# Patient Record
Sex: Female | Born: 1950
Health system: Southern US, Community
[De-identification: ages and names within clinical notes are randomized; demographics above are authoritative.]

## PROBLEM LIST (undated history)

## (undated) DIAGNOSIS — E785 Hyperlipidemia, unspecified: Secondary | ICD-10-CM

## (undated) DIAGNOSIS — E559 Vitamin D deficiency, unspecified: Secondary | ICD-10-CM

## (undated) DIAGNOSIS — M1990 Unspecified osteoarthritis, unspecified site: Secondary | ICD-10-CM

## (undated) HISTORY — DX: Unspecified osteoarthritis, unspecified site: M19.90

## (undated) HISTORY — DX: Hyperlipidemia, unspecified: E78.5

## (undated) HISTORY — DX: Vitamin D deficiency, unspecified: E55.9

## (undated) HISTORY — PX: ABDOMINAL HYSTERECTOMY: SHX81

---

## 2002-11-03 ENCOUNTER — Other Ambulatory Visit: Admission: RE | Admit: 2002-11-03 | Discharge: 2002-11-03 | Payer: Self-pay | Admitting: Gynecology

## 2003-12-30 ENCOUNTER — Other Ambulatory Visit: Admission: RE | Admit: 2003-12-30 | Discharge: 2003-12-30 | Payer: Self-pay | Admitting: Gynecology

## 2004-09-19 ENCOUNTER — Observation Stay (HOSPITAL_COMMUNITY): Admission: RE | Admit: 2004-09-19 | Discharge: 2004-09-20 | Payer: Self-pay | Admitting: Gynecology

## 2005-11-09 ENCOUNTER — Other Ambulatory Visit: Admission: RE | Admit: 2005-11-09 | Discharge: 2005-11-09 | Payer: Self-pay | Admitting: Gynecology

## 2010-11-16 ENCOUNTER — Other Ambulatory Visit: Payer: Self-pay | Admitting: Gynecology

## 2012-10-16 ENCOUNTER — Encounter: Payer: Self-pay | Admitting: Internal Medicine

## 2012-10-16 ENCOUNTER — Ambulatory Visit (INDEPENDENT_AMBULATORY_CARE_PROVIDER_SITE_OTHER): Payer: PRIVATE HEALTH INSURANCE | Admitting: Nurse Practitioner

## 2012-10-16 ENCOUNTER — Encounter: Payer: Self-pay | Admitting: Nurse Practitioner

## 2012-10-16 VITALS — BP 122/84 | HR 81 | Temp 97.2°F | Ht 69.0 in | Wt 192.0 lb

## 2012-10-16 DIAGNOSIS — E1169 Type 2 diabetes mellitus with other specified complication: Secondary | ICD-10-CM | POA: Insufficient documentation

## 2012-10-16 DIAGNOSIS — Z13228 Encounter for screening for other metabolic disorders: Secondary | ICD-10-CM

## 2012-10-16 DIAGNOSIS — Z1211 Encounter for screening for malignant neoplasm of colon: Secondary | ICD-10-CM

## 2012-10-16 DIAGNOSIS — E785 Hyperlipidemia, unspecified: Secondary | ICD-10-CM

## 2012-10-16 DIAGNOSIS — Z1321 Encounter for screening for nutritional disorder: Secondary | ICD-10-CM

## 2012-10-16 LAB — COMPLETE METABOLIC PANEL WITH GFR
ALT: 10 U/L (ref 0–35)
AST: 14 U/L (ref 0–37)
Albumin: 4 g/dL (ref 3.5–5.2)
Alkaline Phosphatase: 95 U/L (ref 39–117)
Chloride: 106 mEq/L (ref 96–112)
GFR, Est African American: 84 mL/min
GFR, Est Non African American: 73 mL/min
Glucose, Bld: 83 mg/dL (ref 70–99)
Sodium: 143 mEq/L (ref 135–145)
Total Bilirubin: 0.3 mg/dL (ref 0.3–1.2)

## 2012-10-16 MED ORDER — ATORVASTATIN CALCIUM 10 MG PO TABS: 10.0000 mg | ORAL_TABLET | Freq: Every day | ORAL | Status: DC

## 2012-10-16 NOTE — Progress Notes (Signed)
  Subjective:    Patient ID: Lindsey Phillips, female    DOB: 1951/03/16, 62 y.o.   MRN: [id number]  Hyperlipidemia This is a chronic problem. The current episode started more than 1 year ago. Lipid results: New patient to practice so we have no record of last labs. There are no known factors aggravating her hyperlipidemia. Pertinent negatives include no chest pain, focal sensory loss, leg pain, myalgias or shortness of breath. Current antihyperlipidemic treatment includes statins. Compliance problems include adherence to diet.  Risk factors for coronary artery disease include post-menopausal.      Review of Systems  Constitutional: Negative.   HENT: Negative.   Eyes: Negative.   Respiratory: Negative.  Negative for shortness of breath.   Cardiovascular: Negative for chest pain.  Endocrine: Negative.   Genitourinary: Negative.   Musculoskeletal: Negative.  Negative for myalgias.  Skin: Negative.   Neurological: Negative.   Hematological: Negative.   Psychiatric/Behavioral: Negative.        Objective:   Physical Exam  Constitutional: She is oriented to person, place, and time. She appears well-developed and well-nourished.  HENT:  Head: Normocephalic.  Right Ear: External ear normal.  Nose: Nose normal.  Eyes: EOM are normal. Pupils are equal, round, and reactive to light.  Neck: Normal range of motion. Neck supple. No JVD present. Carotid bruit is not present.  Cardiovascular: Normal rate, regular rhythm, normal heart sounds and intact distal pulses.   No murmur heard. Pulmonary/Chest: Effort normal and breath sounds normal. She has no wheezes. She has no rales.  Abdominal: Soft. Bowel sounds are normal. She exhibits no distension and no mass.  Musculoskeletal: Normal range of motion.  Lymphadenopathy:    She has no cervical adenopathy.  Neurological: She is alert and oriented to person, place, and time. She has normal reflexes.  Skin: Skin is warm and dry.  Psychiatric: She has  a normal mood and affect. Her behavior is normal. Judgment and thought content normal.  BP 122/84  Pulse 81  Temp(Src) 97.2 F (36.2 C) (Oral)  Ht 5\' 9"  (1.753 m)  Wt 192 lb (87.091 kg)  BMI 28.34 kg/m2         Assessment & Plan:  1. Hyperlipidemia LDL goal < 100 - COMPLETE METABOLIC PANEL WITH GFR - NMR Lipoprofile with Lipids  2. Encounter for vitamin deficiency screening - Vitamin D 25 hydroxy  Lipitor 10 Mg 1 PO QD Low fat diet and exercise Labs pending  Referral For repeat Colonoscopy Will schedule Mammogram on mobile truck Will schedule CPE for PAP Mary-Margaret Daphine Deutscher, FNP

## 2012-10-16 NOTE — Patient Instructions (Signed)

## 2012-10-17 ENCOUNTER — Other Ambulatory Visit: Payer: Self-pay | Admitting: Nurse Practitioner

## 2012-10-17 ENCOUNTER — Telehealth: Payer: Self-pay | Admitting: Nurse Practitioner

## 2012-10-17 LAB — NMR LIPOPROFILE WITH LIPIDS
HDL Size: 8.5 nm — ABNORMAL LOW (ref >=9.2)
HDL-C: 35 mg/dL — ABNORMAL LOW (ref >=40)
LDL Size: 20.3 nm — ABNORMAL LOW (ref >20.5)
LP-IR Score: 66 — ABNORMAL HIGH (ref <=45)
Large HDL-P: 1.5 umol/L — ABNORMAL LOW (ref >=4.8)
Small LDL Particle Number: 1021 nmol/L — ABNORMAL HIGH (ref <=527)
Triglycerides: 96 mg/dL (ref <150)
VLDL Size: 46.5 nm (ref <=46.6)

## 2012-10-17 MED ORDER — ATORVASTATIN CALCIUM 20 MG PO TABS: 20.0000 mg | ORAL_TABLET | Freq: Every day | ORAL | Status: DC

## 2012-10-17 MED ORDER — VITAMIN D (ERGOCALCIFEROL) 1.25 MG (50000 UNIT) PO CAPS: 50000.0000 [IU] | ORAL_CAPSULE | ORAL | Status: DC

## 2012-10-17 NOTE — Telephone Encounter (Signed)
Pt. Notified of lab results. 

## 2012-12-11 ENCOUNTER — Encounter: Payer: Self-pay | Admitting: Internal Medicine

## 2013-05-26 ENCOUNTER — Encounter: Payer: Self-pay | Admitting: Gynecology

## 2015-11-17 ENCOUNTER — Encounter (INDEPENDENT_AMBULATORY_CARE_PROVIDER_SITE_OTHER): Payer: Self-pay

## 2015-11-18 ENCOUNTER — Encounter: Payer: Self-pay | Admitting: Nurse Practitioner

## 2015-11-18 ENCOUNTER — Ambulatory Visit (INDEPENDENT_AMBULATORY_CARE_PROVIDER_SITE_OTHER): Payer: Medicare Other

## 2015-11-18 ENCOUNTER — Ambulatory Visit (INDEPENDENT_AMBULATORY_CARE_PROVIDER_SITE_OTHER): Payer: Medicare Other | Admitting: Nurse Practitioner

## 2015-11-18 VITALS — BP 123/73 | HR 83 | Temp 97.6°F | Ht 69.0 in | Wt 201.8 lb

## 2015-11-18 DIAGNOSIS — Z1159 Encounter for screening for other viral diseases: Secondary | ICD-10-CM | POA: Diagnosis not present

## 2015-11-18 DIAGNOSIS — Z23 Encounter for immunization: Secondary | ICD-10-CM

## 2015-11-18 DIAGNOSIS — E785 Hyperlipidemia, unspecified: Secondary | ICD-10-CM

## 2015-11-18 DIAGNOSIS — Z9289 Personal history of other medical treatment: Secondary | ICD-10-CM

## 2015-11-18 DIAGNOSIS — Z1212 Encounter for screening for malignant neoplasm of rectum: Secondary | ICD-10-CM | POA: Diagnosis not present

## 2015-11-18 NOTE — Patient Instructions (Signed)
Health Maintenance, Female Adopting a healthy lifestyle and getting preventive care can go a long way to promote health and wellness. Talk with your health care provider about what schedule of regular examinations is right for you. This is a good chance for you to check in with your provider about disease prevention and staying healthy. In between checkups, there are plenty of things you can do on your own. Experts have done a lot of research about which lifestyle changes and preventive measures are most likely to keep you healthy. Ask your health care provider for more information. WEIGHT AND DIET  Eat a healthy diet  Be sure to include plenty of vegetables, fruits, low-fat dairy products, and lean protein.  Do not eat a lot of foods high in solid fats, added sugars, or salt.  Get regular exercise. This is one of the most important things you can do for your health.  Most adults should exercise for at least 150 minutes each week. The exercise should increase your heart rate and make you sweat (moderate-intensity exercise).  Most adults should also do strengthening exercises at least twice a week. This is in addition to the moderate-intensity exercise.  Maintain a healthy weight  Body mass index (BMI) is a measurement that can be used to identify possible weight problems. It estimates body fat based on height and weight. Your health care provider can help determine your BMI and help you achieve or maintain a healthy weight.  For females 20 years of age and older:   A BMI below 18.5 is considered underweight.  A BMI of 18.5 to 24.9 is normal.  A BMI of 25 to 29.9 is considered overweight.  A BMI of 30 and above is considered obese.  Watch levels of cholesterol and blood lipids  You should start having your blood tested for lipids and cholesterol at 65 years of age, then have this test every 5 years.  You may need to have your cholesterol levels checked more often if:  Your lipid  or cholesterol levels are high.  You are older than 65 years of age.  You are at high risk for heart disease.  CANCER SCREENING   Lung Cancer  Lung cancer screening is recommended for adults 55-80 years old who are at high risk for lung cancer because of a history of smoking.  A yearly low-dose CT scan of the lungs is recommended for people who:  Currently smoke.  Have quit within the past 15 years.  Have at least a 30-pack-year history of smoking. A pack year is smoking an average of one pack of cigarettes a day for 1 year.  Yearly screening should continue until it has been 15 years since you quit.  Yearly screening should stop if you develop a health problem that would prevent you from having lung cancer treatment.  Breast Cancer  Practice breast self-awareness. This means understanding how your breasts normally appear and feel.  It also means doing regular breast self-exams. Let your health care provider know about any changes, no matter how small.  If you are in your 20s or 30s, you should have a clinical breast exam (CBE) by a health care provider every 1-3 years as part of a regular health exam.  If you are 40 or older, have a CBE every year. Also consider having a breast X-ray (mammogram) every year.  If you have a family history of breast cancer, talk to your health care provider about genetic screening.  If you   are at high risk for breast cancer, talk to your health care provider about having an MRI and a mammogram every year.  Breast cancer gene (BRCA) assessment is recommended for women who have family members with BRCA-related cancers. BRCA-related cancers include:  Breast.  Ovarian.  Tubal.  Peritoneal cancers.  Results of the assessment will determine the need for genetic counseling and BRCA1 and BRCA2 testing. Cervical Cancer Your health care provider may recommend that you be screened regularly for cancer of the pelvic organs (ovaries, uterus, and  vagina). This screening involves a pelvic examination, including checking for microscopic changes to the surface of your cervix (Pap test). You may be encouraged to have this screening done every 3 years, beginning at age 21.  For women ages 30-65, health care providers may recommend pelvic exams and Pap testing every 3 years, or they may recommend the Pap and pelvic exam, combined with testing for human papilloma virus (HPV), every 5 years. Some types of HPV increase your risk of cervical cancer. Testing for HPV may also be done on women of any age with unclear Pap test results.  Other health care providers may not recommend any screening for nonpregnant women who are considered low risk for pelvic cancer and who do not have symptoms. Ask your health care provider if a screening pelvic exam is right for you.  If you have had past treatment for cervical cancer or a condition that could lead to cancer, you need Pap tests and screening for cancer for at least 20 years after your treatment. If Pap tests have been discontinued, your risk factors (such as having a new sexual partner) need to be reassessed to determine if screening should resume. Some women have medical problems that increase the chance of getting cervical cancer. In these cases, your health care provider may recommend more frequent screening and Pap tests. Colorectal Cancer  This type of cancer can be detected and often prevented.  Routine colorectal cancer screening usually begins at 65 years of age and continues through 65 years of age.  Your health care provider may recommend screening at an earlier age if you have risk factors for colon cancer.  Your health care provider may also recommend using home test kits to check for hidden blood in the stool.  A small camera at the end of a tube can be used to examine your colon directly (sigmoidoscopy or colonoscopy). This is done to check for the earliest forms of colorectal  cancer.  Routine screening usually begins at age 50.  Direct examination of the colon should be repeated every 5-10 years through 65 years of age. However, you may need to be screened more often if early forms of precancerous polyps or small growths are found. Skin Cancer  Check your skin from head to toe regularly.  Tell your health care provider about any new moles or changes in moles, especially if there is a change in a mole's shape or color.  Also tell your health care provider if you have a mole that is larger than the size of a pencil eraser.  Always use sunscreen. Apply sunscreen liberally and repeatedly throughout the day.  Protect yourself by wearing long sleeves, pants, a wide-brimmed hat, and sunglasses whenever you are outside. HEART DISEASE, DIABETES, AND HIGH BLOOD PRESSURE   High blood pressure causes heart disease and increases the risk of stroke. High blood pressure is more likely to develop in:  People who have blood pressure in the high end   of the normal range (130-139/85-89 mm Hg).  People who are overweight or obese.  People who are African American.  If you are 33-67 years of age, have your blood pressure checked every 3-5 years. If you are 4 years of age or older, have your blood pressure checked every year. You should have your blood pressure measured twice--once when you are at a hospital or clinic, and once when you are not at a hospital or clinic. Record the average of the two measurements. To check your blood pressure when you are not at a hospital or clinic, you can use:  An automated blood pressure machine at a pharmacy.  A home blood pressure monitor.  If you are between 66 years and 96 years old, ask your health care provider if you should take aspirin to prevent strokes.  Have regular diabetes screenings. This involves taking a blood sample to check your fasting blood sugar level.  If you are at a normal weight and have a low risk for diabetes,  have this test once every three years after 65 years of age.  If you are overweight and have a high risk for diabetes, consider being tested at a younger age or more often. PREVENTING INFECTION  Hepatitis B  If you have a higher risk for hepatitis B, you should be screened for this virus. You are considered at high risk for hepatitis B if:  You were born in a country where hepatitis B is common. Ask your health care provider which countries are considered high risk.  Your parents were born in a high-risk country, and you have not been immunized against hepatitis B (hepatitis B vaccine).  You have HIV or AIDS.  You use needles to inject street drugs.  You live with someone who has hepatitis B.  You have had sex with someone who has hepatitis B.  You get hemodialysis treatment.  You take certain medicines for conditions, including cancer, organ transplantation, and autoimmune conditions. Hepatitis C  Blood testing is recommended for:  Everyone born from [birth year] through 1965.  Anyone with known risk factors for hepatitis C. Sexually transmitted infections (STIs)  You should be screened for sexually transmitted infections (STIs) including gonorrhea and chlamydia if:  You are sexually active and are younger than 65 years of age.  You are older than 65 years of age and your health care provider tells you that you are at risk for this type of infection.  Your sexual activity has changed since you were last screened and you are at an increased risk for chlamydia or gonorrhea. Ask your health care provider if you are at risk.  If you do not have HIV, but are at risk, it may be recommended that you take a prescription medicine daily to prevent HIV infection. This is called pre-exposure prophylaxis (PrEP). You are considered at risk if:  You are sexually active and do not regularly use condoms or know the HIV status of your partner(s).  You take drugs by injection.  You are sexually  active with a partner who has HIV. Talk with your health care provider about whether you are at high risk of being infected with HIV. If you choose to begin PrEP, you should first be tested for HIV. You should then be tested every 3 months for as long as you are taking PrEP.  PREGNANCY   If you are premenopausal and you may become pregnant, ask your health care provider about preconception counseling.  If you may  become pregnant, take 400 to 800 micrograms (mcg) of folic acid every day.  If you want to prevent pregnancy, talk to your health care provider about birth control (contraception). OSTEOPOROSIS AND MENOPAUSE   Osteoporosis is a disease in which the bones lose minerals and strength with aging. This can result in serious bone fractures. Your risk for osteoporosis can be identified using a bone density scan.  If you are 61 years of age or older, or if you are at risk for osteoporosis and fractures, ask your health care provider if you should be screened.  Ask your health care provider whether you should take a calcium or vitamin D supplement to lower your risk for osteoporosis.  Menopause may have certain physical symptoms and risks.  Hormone replacement therapy may reduce some of these symptoms and risks. Talk to your health care provider about whether hormone replacement therapy is right for you.  HOME CARE INSTRUCTIONS   Schedule regular health, dental, and eye exams.  Stay current with your immunizations.   Do not use any tobacco products including cigarettes, chewing tobacco, or electronic cigarettes.  If you are pregnant, do not drink alcohol.  If you are breastfeeding, limit how much and how often you drink alcohol.  Limit alcohol intake to no more than 1 drink per day for nonpregnant women. One drink equals 12 ounces of beer, 5 ounces of wine, or 1 ounces of hard liquor.  Do not use street drugs.  Do not share needles.  Ask your health care provider for help if  you need support or information about quitting drugs.  Tell your health care provider if you often feel depressed.  Tell your health care provider if you have ever been abused or do not feel safe at home.   This information is not intended to replace advice given to you by your health care provider. Make sure you discuss any questions you have with your health care provider.   Document Released: 01/16/2011 Document Revised: 07/24/2014 Document Reviewed: 06/04/2013 Elsevier Interactive Patient Education [employer].

## 2015-11-18 NOTE — Progress Notes (Signed)
   Subjective:    Patient ID: Lindsey Phillips, female    DOB: [birth year], 65 y.o.   MRN: [id number]   Patient here today for follow up of chronic medical problems.  Outpatient Encounter Prescriptions as of 11/18/2015  Medication Sig  . atorvastatin (LIPITOR) 20 MG tablet Take 1 tablet (20 mg total) by mouth daily.  . Vitamin D, Ergocalciferol, (DRISDOL) 50000 UNITS CAPS Take 1 capsule (50,000 Units total) by mouth every 7 (seven) days.   No facility-administered encounter medications on file as of 11/18/2015.   * Patient has not been seen since 2014 and has been out of cholesterol meds for some time. Hyperlipidemia This is a chronic problem. The current episode started more than 1 year ago. Recent lipid tests were reviewed and are variable. There are no known factors aggravating her hyperlipidemia. She is currently on no antihyperlipidemic treatment. The current treatment provides moderate improvement of lipids. Compliance problems include adherence to diet and adherence to exercise.  Risk factors for coronary artery disease include dyslipidemia and post-menopausal.      Review of Systems  Constitutional: Negative.   HENT: Negative.   Respiratory: Negative.   Cardiovascular: Negative.   Genitourinary: Negative.   Neurological: Negative.   Psychiatric/Behavioral: Negative.   All other systems reviewed and are negative.      Objective:   Physical Exam  Constitutional: She is oriented to Phillips, place, and time. She appears well-developed and well-nourished.  HENT:  Nose: Nose normal.  Mouth/Throat: Oropharynx is clear and moist.  Eyes: EOM are normal.  Neck: Trachea normal, normal range of motion and full passive range of motion without pain. Neck supple. No JVD present. Carotid bruit is not present. No thyromegaly present.  Cardiovascular: Normal rate, regular rhythm, normal heart sounds and intact distal pulses.  Exam reveals no gallop and no friction rub.   No murmur  heard. Pulmonary/Chest: Effort normal and breath sounds normal.  Abdominal: Soft. Bowel sounds are normal. She exhibits no distension and no mass. There is no tenderness.  Musculoskeletal: Normal range of motion.  Lymphadenopathy:    She has no cervical adenopathy.  Neurological: She is alert and oriented to Phillips, place, and time. She has normal reflexes.  Skin: Skin is warm and dry.  Psychiatric: She has a normal mood and affect. Her behavior is normal. Judgment and thought content normal.    BP 123/73 mmHg  Pulse 83  Temp(Src) 97.6 F (36.4 C) (Oral)  Ht _0  (1.753 m)  Wt 201 lb 12.8 oz (91.536 kg)  BMI 29.79 kg/m2  Chest x ray- no cardiopulmonary disease noted-Preliminary reading by Ronnald Collum, FNP  [facility]  EKG- Kerry Hough, FNP      Assessment & Plan:  1. Hyperlipidemia with target LDL less than 100 Low fat diet - CMP14+EGFR - Lipid panel - DG Chest 2 View; Future - EKG 12-Lead  2. Screening for malignant neoplasm of the rectum - Fecal occult blood, imunochemical; Future - Ambulatory referral to Gastroenterology  3. Need for hepatitis C screening test - Hepatitis C antibody  4. H/O mammogram - MM Digital Screening; Future   Will schedule PAP for next visit Labs pending Health maintenance reviewed Diet and exercise encouraged Continue all meds Follow up  In 6 months   [place], FNP

## 2015-11-18 NOTE — Addendum Note (Signed)
Addended by: Caryl BisBOWMAN, Mikel Hardgrove M on: 11/18/2015 05:00 PM   Modules accepted: Orders, SmartSet

## 2015-11-19 LAB — LIPID PANEL
CHOLESTEROL TOTAL: 247 mg/dL — AB (ref 100–199)
Chol/HDL Ratio: 7.3 ratio units — ABNORMAL HIGH (ref 0.0–4.4)
HDL: 34 mg/dL — ABNORMAL LOW (ref >39)
LDL Calculated: 174 mg/dL — ABNORMAL HIGH (ref 0–99)
Triglycerides: 196 mg/dL — ABNORMAL HIGH (ref 0–149)
VLDL Cholesterol Cal: 39 mg/dL (ref 5–40)

## 2015-11-19 LAB — CMP14+EGFR
ALT: 14 IU/L (ref 0–32)
AST: 20 IU/L (ref 0–40)
Albumin/Globulin Ratio: 1.3 (ref 1.2–2.2)
Albumin: 4.4 g/dL (ref 3.6–4.8)
Alkaline Phosphatase: 104 IU/L (ref 39–117)
BUN/Creatinine Ratio: 16 (ref 12–28)
BUN: 16 mg/dL (ref 8–27)
Bilirubin Total: 0.2 mg/dL (ref 0.0–1.2)
CALCIUM: 9.5 mg/dL (ref 8.7–10.3)
CHLORIDE: 103 mmol/L (ref 96–106)
CO2: 25 mmol/L (ref 18–29)
CREATININE: 1.01 mg/dL — AB (ref 0.57–1.00)
GFR calc Af Amer: 68 mL/min/{1.73_m2} (ref >59)
GFR, EST NON AFRICAN AMERICAN: 59 mL/min/{1.73_m2} — AB (ref >59)
GLOBULIN, TOTAL: 3.3 g/dL (ref 1.5–4.5)
Glucose: 74 mg/dL (ref 65–99)
POTASSIUM: 3.4 mmol/L — AB (ref 3.5–5.2)
Sodium: 143 mmol/L (ref 134–144)
Total Protein: 7.7 g/dL (ref 6.0–8.5)

## 2015-11-19 LAB — HEPATITIS C ANTIBODY

## 2016-02-14 ENCOUNTER — Encounter: Payer: Medicare Other | Admitting: *Deleted

## 2016-02-14 DIAGNOSIS — Z1231 Encounter for screening mammogram for malignant neoplasm of breast: Secondary | ICD-10-CM | POA: Diagnosis not present

## 2016-05-23 ENCOUNTER — Encounter: Payer: Self-pay | Admitting: Nurse Practitioner

## 2016-05-23 ENCOUNTER — Ambulatory Visit (INDEPENDENT_AMBULATORY_CARE_PROVIDER_SITE_OTHER): Payer: Medicare Other | Admitting: Nurse Practitioner

## 2016-05-23 VITALS — BP 124/79 | HR 84 | Temp 96.8°F | Ht 69.0 in | Wt 198.0 lb

## 2016-05-23 DIAGNOSIS — E785 Hyperlipidemia, unspecified: Secondary | ICD-10-CM | POA: Diagnosis not present

## 2016-05-23 DIAGNOSIS — E559 Vitamin D deficiency, unspecified: Secondary | ICD-10-CM | POA: Diagnosis not present

## 2016-05-23 DIAGNOSIS — Z6829 Body mass index (BMI) 29.0-29.9, adult: Secondary | ICD-10-CM | POA: Diagnosis not present

## 2016-05-23 MED ORDER — ATORVASTATIN CALCIUM 20 MG PO TABS: 20.0000 mg | ORAL_TABLET | Freq: Every day | ORAL | 1 refills | Status: DC

## 2016-05-23 NOTE — Progress Notes (Signed)
   Subjective:    Patient ID: Lindsey Phillips, female    DOB: 28-Sep-1950, 65 y.o.   MRN: [id number]   Patient here today for follow up of chronic medical problems.  Outpatient Encounter Prescriptions as of 05/23/2016  Medication Sig  . atorvastatin (LIPITOR) 20 MG tablet Take 1 tablet (20 mg total) by mouth daily.   Hyperlipidemia  This is a chronic problem. The current episode started more than 1 year ago. Recent lipid tests were reviewed and are variable. There are no known factors aggravating her hyperlipidemia. She is currently on no antihyperlipidemic treatment. The current treatment provides moderate improvement of lipids. Compliance problems include adherence to diet and adherence to exercise.  Risk factors for coronary artery disease include dyslipidemia and post-menopausal.  Vitamin d def Patient is no longer taking vitamin d- needs levels checked   Review of Systems  Constitutional: Negative.   HENT: Negative.   Respiratory: Negative.   Cardiovascular: Negative.   Genitourinary: Negative.   Neurological: Negative.   Psychiatric/Behavioral: Negative.   All other systems reviewed and are negative.      Objective:   Physical Exam  Constitutional: She is oriented to person, place, and time. She appears well-developed and well-nourished.  HENT:  Nose: Nose normal.  Mouth/Throat: Oropharynx is clear and moist.  Eyes: EOM are normal.  Neck: Trachea normal, normal range of motion and full passive range of motion without pain. Neck supple. No JVD present. Carotid bruit is not present. No thyromegaly present.  Cardiovascular: Normal rate, regular rhythm, normal heart sounds and intact distal pulses.  Exam reveals no gallop and no friction rub.   No murmur heard. Pulmonary/Chest: Effort normal and breath sounds normal.  Abdominal: Soft. Bowel sounds are normal. She exhibits no distension and no mass. There is no tenderness.  Musculoskeletal: Normal range of motion.    Lymphadenopathy:    She has no cervical adenopathy.  Neurological: She is alert and oriented to person, place, and time. She has normal reflexes.  Skin: Skin is warm and dry.  Psychiatric: She has a normal mood and affect. Her behavior is normal. Judgment and thought content normal.    BP 124/79   Pulse 84   Temp (!) 96.8 F (36 C) (Oral)   Ht '5\' 9"'$  (1.753 m)   Wt 198 lb (89.8 kg)   BMI 29.24 kg/m        Assessment & Plan:  1. Hyperlipidemia with target LDL less than 100 Low fat diet - atorvastatin (LIPITOR) 20 MG tablet; Take 1 tablet (20 mg total) by mouth daily.  Dispense: 90 tablet; Refill: 1 - CMP14+EGFR - Lipid panel  2. Vitamin D deficiency - VITAMIN D 25 Hydroxy (Vit-D Deficiency, Fractures)  3. BMI 29.0-29.9,adult Discussed diet and exercise for person with BMI >25 Will recheck weight in 3-6 months    Labs pending Health maintenance reviewed Diet and exercise encouraged Continue all meds Follow up  In 3 months   [place], FNP

## 2016-05-23 NOTE — Patient Instructions (Signed)
Health Maintenance, Female Adopting a healthy lifestyle and getting preventive care can go a long way to promote health and wellness. Talk with your health care provider about what schedule of regular examinations is right for you. This is a good chance for you to check in with your provider about disease prevention and staying healthy. In between checkups, there are plenty of things you can do on your own. Experts have done a lot of research about which lifestyle changes and preventive measures are most likely to keep you healthy. Ask your health care provider for more information. WEIGHT AND DIET  Eat a healthy diet  Be sure to include plenty of vegetables, fruits, low-fat dairy products, and lean protein.  Do not eat a lot of foods high in solid fats, added sugars, or salt.  Get regular exercise. This is one of the most important things you can do for your health.  Most adults should exercise for at least 150 minutes each week. The exercise should increase your heart rate and make you sweat (moderate-intensity exercise).  Most adults should also do strengthening exercises at least twice a week. This is in addition to the moderate-intensity exercise.  Maintain a healthy weight  Body mass index (BMI) is a measurement that can be used to identify possible weight problems. It estimates body fat based on height and weight. Your health care provider can help determine your BMI and help you achieve or maintain a healthy weight.  For females 20 years of age and older:   A BMI below 18.5 is considered underweight.  A BMI of 18.5 to 24.9 is normal.  A BMI of 25 to 29.9 is considered overweight.  A BMI of 30 and above is considered obese.  Watch levels of cholesterol and blood lipids  You should start having your blood tested for lipids and cholesterol at 65 years of age, then have this test every 5 years.  You may need to have your cholesterol levels checked more often if:  Your lipid  or cholesterol levels are high.  You are older than 65 years of age.  You are at high risk for heart disease.  CANCER SCREENING   Lung Cancer  Lung cancer screening is recommended for adults 55-80 years old who are at high risk for lung cancer because of a history of smoking.  A yearly low-dose CT scan of the lungs is recommended for people who:  Currently smoke.  Have quit within the past 15 years.  Have at least a 30-pack-year history of smoking. A pack year is smoking an average of one pack of cigarettes a day for 1 year.  Yearly screening should continue until it has been 15 years since you quit.  Yearly screening should stop if you develop a health problem that would prevent you from having lung cancer treatment.  Breast Cancer  Practice breast self-awareness. This means understanding how your breasts normally appear and feel.  It also means doing regular breast self-exams. Let your health care provider know about any changes, no matter how small.  If you are in your 20s or 30s, you should have a clinical breast exam (CBE) by a health care provider every 1-3 years as part of a regular health exam.  If you are 40 or older, have a CBE every year. Also consider having a breast X-ray (mammogram) every year.  If you have a family history of breast cancer, talk to your health care provider about genetic screening.  If you   are at high risk for breast cancer, talk to your health care provider about having an MRI and a mammogram every year.  Breast cancer gene (BRCA) assessment is recommended for women who have family members with BRCA-related cancers. BRCA-related cancers include:  Breast.  Ovarian.  Tubal.  Peritoneal cancers.  Results of the assessment will determine the need for genetic counseling and BRCA1 and BRCA2 testing. Cervical Cancer Your health care provider may recommend that you be screened regularly for cancer of the pelvic organs (ovaries, uterus, and  vagina). This screening involves a pelvic examination, including checking for microscopic changes to the surface of your cervix (Pap test). You may be encouraged to have this screening done every 3 years, beginning at age 21.  For women ages 30-65, health care providers may recommend pelvic exams and Pap testing every 3 years, or they may recommend the Pap and pelvic exam, combined with testing for human papilloma virus (HPV), every 5 years. Some types of HPV increase your risk of cervical cancer. Testing for HPV may also be done on women of any age with unclear Pap test results.  Other health care providers may not recommend any screening for nonpregnant women who are considered low risk for pelvic cancer and who do not have symptoms. Ask your health care provider if a screening pelvic exam is right for you.  If you have had past treatment for cervical cancer or a condition that could lead to cancer, you need Pap tests and screening for cancer for at least 20 years after your treatment. If Pap tests have been discontinued, your risk factors (such as having a new sexual partner) need to be reassessed to determine if screening should resume. Some women have medical problems that increase the chance of getting cervical cancer. In these cases, your health care provider may recommend more frequent screening and Pap tests. Colorectal Cancer  This type of cancer can be detected and often prevented.  Routine colorectal cancer screening usually begins at 65 years of age and continues through 65 years of age.  Your health care provider may recommend screening at an earlier age if you have risk factors for colon cancer.  Your health care provider may also recommend using home test kits to check for hidden blood in the stool.  A small camera at the end of a tube can be used to examine your colon directly (sigmoidoscopy or colonoscopy). This is done to check for the earliest forms of colorectal  cancer.  Routine screening usually begins at age 50.  Direct examination of the colon should be repeated every 5-10 years through 65 years of age. However, you may need to be screened more often if early forms of precancerous polyps or small growths are found. Skin Cancer  Check your skin from head to toe regularly.  Tell your health care provider about any new moles or changes in moles, especially if there is a change in a mole's shape or color.  Also tell your health care provider if you have a mole that is larger than the size of a pencil eraser.  Always use sunscreen. Apply sunscreen liberally and repeatedly throughout the day.  Protect yourself by wearing long sleeves, pants, a wide-brimmed hat, and sunglasses whenever you are outside. HEART DISEASE, DIABETES, AND HIGH BLOOD PRESSURE   High blood pressure causes heart disease and increases the risk of stroke. High blood pressure is more likely to develop in:  People who have blood pressure in the high end   of the normal range (130-139/85-89 mm Hg).  People who are overweight or obese.  People who are African American.  If you are 38-23 years of age, have your blood pressure checked every 3-5 years. If you are 61 years of age or older, have your blood pressure checked every year. You should have your blood pressure measured twice--once when you are at a hospital or clinic, and once when you are not at a hospital or clinic. Record the average of the two measurements. To check your blood pressure when you are not at a hospital or clinic, you can use:  An automated blood pressure machine at a pharmacy.  A home blood pressure monitor.  If you are between 45 years and 39 years old, ask your health care provider if you should take aspirin to prevent strokes.  Have regular diabetes screenings. This involves taking a blood sample to check your fasting blood sugar level.  If you are at a normal weight and have a low risk for diabetes,  have this test once every three years after 65 years of age.  If you are overweight and have a high risk for diabetes, consider being tested at a younger age or more often. PREVENTING INFECTION  Hepatitis B  If you have a higher risk for hepatitis B, you should be screened for this virus. You are considered at high risk for hepatitis B if:  You were born in a country where hepatitis B is common. Ask your health care provider which countries are considered high risk.  Your parents were born in a high-risk country, and you have not been immunized against hepatitis B (hepatitis B vaccine).  You have HIV or AIDS.  You use needles to inject street drugs.  You live with someone who has hepatitis B.  You have had sex with someone who has hepatitis B.  You get hemodialysis treatment.  You take certain medicines for conditions, including cancer, organ transplantation, and autoimmune conditions. Hepatitis C  Blood testing is recommended for:  Everyone born from [birth year] through 1965.  Anyone with known risk factors for hepatitis C. Sexually transmitted infections (STIs)  You should be screened for sexually transmitted infections (STIs) including gonorrhea and chlamydia if:  You are sexually active and are younger than 65 years of age.  You are older than 65 years of age and your health care provider tells you that you are at risk for this type of infection.  Your sexual activity has changed since you were last screened and you are at an increased risk for chlamydia or gonorrhea. Ask your health care provider if you are at risk.  If you do not have HIV, but are at risk, it may be recommended that you take a prescription medicine daily to prevent HIV infection. This is called pre-exposure prophylaxis (PrEP). You are considered at risk if:  You are sexually active and do not regularly use condoms or know the HIV status of your partner(s).  You take drugs by injection.  You are sexually  active with a partner who has HIV. Talk with your health care provider about whether you are at high risk of being infected with HIV. If you choose to begin PrEP, you should first be tested for HIV. You should then be tested every 3 months for as long as you are taking PrEP.  PREGNANCY   If you are premenopausal and you may become pregnant, ask your health care provider about preconception counseling.  If you may  become pregnant, take 400 to 800 micrograms (mcg) of folic acid every day.  If you want to prevent pregnancy, talk to your health care provider about birth control (contraception). OSTEOPOROSIS AND MENOPAUSE   Osteoporosis is a disease in which the bones lose minerals and strength with aging. This can result in serious bone fractures. Your risk for osteoporosis can be identified using a bone density scan.  If you are 61 years of age or older, or if you are at risk for osteoporosis and fractures, ask your health care provider if you should be screened.  Ask your health care provider whether you should take a calcium or vitamin D supplement to lower your risk for osteoporosis.  Menopause may have certain physical symptoms and risks.  Hormone replacement therapy may reduce some of these symptoms and risks. Talk to your health care provider about whether hormone replacement therapy is right for you.  HOME CARE INSTRUCTIONS   Schedule regular health, dental, and eye exams.  Stay current with your immunizations.   Do not use any tobacco products including cigarettes, chewing tobacco, or electronic cigarettes.  If you are pregnant, do not drink alcohol.  If you are breastfeeding, limit how much and how often you drink alcohol.  Limit alcohol intake to no more than 1 drink per day for nonpregnant women. One drink equals 12 ounces of beer, 5 ounces of wine, or 1 ounces of hard liquor.  Do not use street drugs.  Do not share needles.  Ask your health care provider for help if  you need support or information about quitting drugs.  Tell your health care provider if you often feel depressed.  Tell your health care provider if you have ever been abused or do not feel safe at home.   This information is not intended to replace advice given to you by your health care provider. Make sure you discuss any questions you have with your health care provider.   Document Released: 01/16/2011 Document Revised: 07/24/2014 Document Reviewed: 06/04/2013 Elsevier Interactive Patient Education [employer].

## 2016-05-24 ENCOUNTER — Telehealth: Payer: Self-pay | Admitting: Nurse Practitioner

## 2016-05-24 ENCOUNTER — Other Ambulatory Visit: Payer: Self-pay | Admitting: Nurse Practitioner

## 2016-05-24 LAB — CMP14+EGFR
A/G RATIO: 1.3 (ref 1.2–2.2)
ALT: 12 IU/L (ref 0–32)
AST: 17 IU/L (ref 0–40)
Albumin: 4.1 g/dL (ref 3.6–4.8)
Alkaline Phosphatase: 111 IU/L (ref 39–117)
BILIRUBIN TOTAL: 0.3 mg/dL (ref 0.0–1.2)
BUN/Creatinine Ratio: 20 (ref 12–28)
BUN: 17 mg/dL (ref 8–27)
CALCIUM: 9.5 mg/dL (ref 8.7–10.3)
CO2: 25 mmol/L (ref 18–29)
Chloride: 103 mmol/L (ref 96–106)
Creatinine, Ser: 0.87 mg/dL (ref 0.57–1.00)
GFR, EST AFRICAN AMERICAN: 81 mL/min/{1.73_m2} (ref >59)
GFR, EST NON AFRICAN AMERICAN: 70 mL/min/{1.73_m2} (ref >59)
GLOBULIN, TOTAL: 3.1 g/dL (ref 1.5–4.5)
Glucose: 88 mg/dL (ref 65–99)
POTASSIUM: 4.4 mmol/L (ref 3.5–5.2)
SODIUM: 141 mmol/L (ref 134–144)
Total Protein: 7.2 g/dL (ref 6.0–8.5)

## 2016-05-24 LAB — LIPID PANEL
CHOL/HDL RATIO: 7.4 ratio — AB (ref 0.0–4.4)
Cholesterol, Total: 251 mg/dL — ABNORMAL HIGH (ref 100–199)
HDL: 34 mg/dL — ABNORMAL LOW (ref >39)
LDL Calculated: 191 mg/dL — ABNORMAL HIGH (ref 0–99)
TRIGLYCERIDES: 132 mg/dL (ref 0–149)
VLDL Cholesterol Cal: 26 mg/dL (ref 5–40)

## 2016-05-24 LAB — VITAMIN D 25 HYDROXY (VIT D DEFICIENCY, FRACTURES): Vit D, 25-Hydroxy: 14.4 ng/mL — ABNORMAL LOW (ref 30.0–100.0)

## 2016-05-24 MED ORDER — ATORVASTATIN CALCIUM 40 MG PO TABS: 40.0000 mg | ORAL_TABLET | Freq: Every day | ORAL | 1 refills | Status: DC

## 2016-05-25 NOTE — Telephone Encounter (Signed)
Left detailed message on patient's voicemail with results and recommendations. She should call back if she has any questions.

## 2016-06-29 ENCOUNTER — Ambulatory Visit (INDEPENDENT_AMBULATORY_CARE_PROVIDER_SITE_OTHER): Payer: Medicare Other | Admitting: Family

## 2016-06-29 ENCOUNTER — Ambulatory Visit (INDEPENDENT_AMBULATORY_CARE_PROVIDER_SITE_OTHER): Payer: Medicare Other

## 2016-06-29 ENCOUNTER — Encounter: Payer: Self-pay | Admitting: Family

## 2016-06-29 VITALS — BP 123/79 | HR 95 | Temp 97.6°F | Ht 69.0 in | Wt 197.6 lb

## 2016-06-29 DIAGNOSIS — L03115 Cellulitis of right lower limb: Secondary | ICD-10-CM | POA: Diagnosis not present

## 2016-06-29 DIAGNOSIS — R300 Dysuria: Secondary | ICD-10-CM | POA: Diagnosis not present

## 2016-06-29 DIAGNOSIS — M25561 Pain in right knee: Secondary | ICD-10-CM

## 2016-06-29 LAB — URINALYSIS, COMPLETE
Bilirubin, UA: NEGATIVE
Glucose, UA: NEGATIVE
Ketones, UA: NEGATIVE
LEUKOCYTES UA: NEGATIVE
Nitrite, UA: NEGATIVE
PH UA: 6.5 (ref 5.0–7.5)
Specific Gravity, UA: 1.02 (ref 1.005–1.030)
Urobilinogen, Ur: 0.2 mg/dL (ref 0.2–1.0)

## 2016-06-29 LAB — MICROSCOPIC EXAMINATION: RENAL EPITHEL UA: NONE SEEN /HPF

## 2016-06-29 MED ORDER — CEPHALEXIN 500 MG PO CAPS: 500.0000 mg | ORAL_CAPSULE | Freq: Four times a day (QID) | ORAL | 0 refills | Status: DC

## 2016-06-29 NOTE — Progress Notes (Signed)
   Subjective:    Patient ID: Lindsey Phillips, female    DOB: 01/06/1951, 65 y.o.   MRN: [id number]  Pt presents to the office today with recurrent right leg pain. PT states she was diagnosed last year with sciatica and was given steroids and it "went away". PT states this flared up about 3 days and has become worse.  Leg Pain   The incident occurred 3 to 5 days ago. There was no injury mechanism. The pain is present in the right leg and right ankle. The pain is at a severity of 9/10. The pain is moderate. The pain has been intermittent since onset. Pertinent negatives include no inability to bear weight, loss of motion, loss of sensation, muscle weakness, numbness or tingling. She reports no foreign bodies present. The symptoms are aggravated by weight bearing. She has tried NSAIDs and rest for the symptoms. The treatment provided mild relief.      Review of Systems  Musculoskeletal: Positive for arthralgias.       Right leg pain   Neurological: Negative for tingling and numbness.  All other systems reviewed and are negative.      Objective:   Physical Exam  Constitutional: She is oriented to person, place, and time. She appears well-developed and well-nourished.  Cardiovascular: Normal rate, regular rhythm, normal heart sounds and intact distal pulses.   Pulmonary/Chest: Effort normal and breath sounds normal.  Musculoskeletal: Normal range of motion. She exhibits edema (2+ in right knee, tenderness present with palpation of knee, warmth present. No erythemas) and tenderness.  Neurological: She is alert and oriented to person, place, and time.    Xray knee- No fracture noted Preliminary reading by Jannifer Rodneyhristy Emelio Schneller, FNP [facility]   BP 123/79   Pulse 95   Temp 97.6 F (36.4 C) (Oral)   Ht 5\' 9"  (1.753 m)   Wt 197 lb 9.6 oz (89.6 kg)   BMI 29.18 kg/m      Assessment & Plan:  1. Acute pain of right knee - DG Knee 1-2 Views Right; Future  2. Dysuria - Urinalysis, Complete  3.  Cellulitis of right knee -Rest -Started on Keflex today -Keep elevated -RTO in 1 week, pt to call if swelling, pain becomes worse!!! - cephALEXin (KEFLEX) 500 MG capsule; Take 1 capsule (500 mg total) by mouth 4 (four) times daily.  Dispense: 42 capsule; Refill: 0  Jannifer Rodneyhristy Dayanna Pryce, FNP

## 2016-06-29 NOTE — Patient Instructions (Signed)
Cellulitis, Adult °Introduction °Cellulitis is a skin infection. The infected area is usually red and sore. This condition occurs most often in the arms and lower legs. It is very important to get treated for this condition. °Follow these instructions at home: °· Take over-the-counter and prescription medicines only as told by your doctor. °· If you were prescribed an antibiotic medicine, take it as told by your doctor. Do not stop taking the antibiotic even if you start to feel better. °· Drink enough fluid to keep your pee (urine) clear or pale yellow. °· Do not touch or rub the infected area. °· Raise (elevate) the infected area above the level of your heart while you are sitting or lying down. °· Place warm or cold wet cloths (warm or cold compresses) on the infected area. Do this as told by your doctor. °· Keep all follow-up visits as told by your doctor. This is important. These visits let your doctor make sure your infection is not getting worse. °Contact a doctor if: °· You have a fever. °· Your symptoms do not get better after 1-2 days of treatment. °· Your bone or joint under the infected area starts to hurt after the skin has healed. °· Your infection comes back. This can happen in the same area or another area. °· You have a swollen bump in the infected area. °· You have new symptoms. °· You feel ill and also have muscle aches and pains. °Get help right away if: °· Your symptoms get worse. °· You feel very sleepy. °· You throw up (vomit) or have watery poop (diarrhea) for a long time. °· There are red streaks coming from the infected area. °· Your red area gets larger. °· Your red area turns darker. °This information is not intended to replace advice given to you by your health care provider. Make sure you discuss any questions you have with your health care provider. °Document Released: 12/20/2007 Document Revised: 12/09/2015 Document Reviewed: 05/12/2015 °© 2017 Elsevier ° °

## 2016-07-01 ENCOUNTER — Encounter (HOSPITAL_COMMUNITY): Payer: Self-pay | Admitting: Emergency Medicine

## 2016-07-01 ENCOUNTER — Emergency Department (HOSPITAL_COMMUNITY)
Admission: EM | Admit: 2016-07-01 | Discharge: 2016-07-01 | Disposition: A | Discharge status: Home / Self Care | Payer: Medicare Other | Source: Home / Self Care | Attending: Emergency Medicine | Admitting: Emergency Medicine

## 2016-07-01 DIAGNOSIS — Z5181 Encounter for therapeutic drug level monitoring: Secondary | ICD-10-CM

## 2016-07-01 DIAGNOSIS — E876 Hypokalemia: Secondary | ICD-10-CM | POA: Diagnosis not present

## 2016-07-01 DIAGNOSIS — Z87891 Personal history of nicotine dependence: Secondary | ICD-10-CM | POA: Insufficient documentation

## 2016-07-01 DIAGNOSIS — M009 Pyogenic arthritis, unspecified: Secondary | ICD-10-CM | POA: Diagnosis not present

## 2016-07-01 DIAGNOSIS — M25561 Pain in right knee: Secondary | ICD-10-CM | POA: Diagnosis not present

## 2016-07-01 DIAGNOSIS — Z79899 Other long term (current) drug therapy: Secondary | ICD-10-CM | POA: Insufficient documentation

## 2016-07-01 DIAGNOSIS — M1990 Unspecified osteoarthritis, unspecified site: Secondary | ICD-10-CM | POA: Diagnosis not present

## 2016-07-01 DIAGNOSIS — M00061 Staphylococcal arthritis, right knee: Secondary | ICD-10-CM | POA: Diagnosis not present

## 2016-07-01 DIAGNOSIS — M25461 Effusion, right knee: Secondary | ICD-10-CM

## 2016-07-01 DIAGNOSIS — Z01818 Encounter for other preprocedural examination: Secondary | ICD-10-CM | POA: Diagnosis not present

## 2016-07-01 LAB — CBC WITH DIFFERENTIAL/PLATELET
BASOS PCT: 0 %
Basophils Absolute: 0 10*3/uL (ref 0.0–0.1)
EOS ABS: 0 10*3/uL (ref 0.0–0.7)
Eosinophils Relative: 0 %
HEMATOCRIT: 37.7 % (ref 36.0–46.0)
Hemoglobin: 12.1 g/dL (ref 12.0–15.0)
LYMPHS ABS: 2.4 10*3/uL (ref 0.7–4.0)
Lymphocytes Relative: 18 %
MCH: 27.3 pg (ref 26.0–34.0)
MCHC: 32.1 g/dL (ref 30.0–36.0)
MCV: 85.1 fL (ref 78.0–100.0)
MONOS PCT: 11 %
Monocytes Absolute: 1.4 10*3/uL — ABNORMAL HIGH (ref 0.1–1.0)
NEUTROS ABS: 9.3 10*3/uL — AB (ref 1.7–7.7)
NEUTROS PCT: 71 %
PLATELETS: ADEQUATE 10*3/uL (ref 150–400)
RBC: 4.43 MIL/uL (ref 3.87–5.11)
RDW: 15.2 % (ref 11.5–15.5)
WBC: 13.2 10*3/uL — AB (ref 4.0–10.5)

## 2016-07-01 LAB — BASIC METABOLIC PANEL
Anion gap: 10 (ref 5–15)
BUN: 17 mg/dL (ref 6–20)
CHLORIDE: 105 mmol/L (ref 101–111)
CO2: 24 mmol/L (ref 22–32)
CREATININE: 1.08 mg/dL — AB (ref 0.44–1.00)
Calcium: 9.5 mg/dL (ref 8.9–10.3)
GFR calc Af Amer: >60 mL/min (ref >60)
GFR calc non Af Amer: 53 mL/min — ABNORMAL LOW (ref >60)
Glucose, Bld: 114 mg/dL — ABNORMAL HIGH (ref 65–99)
POTASSIUM: 3 mmol/L — AB (ref 3.5–5.1)
SODIUM: 139 mmol/L (ref 135–145)

## 2016-07-01 LAB — GRAM STAIN: SPECIAL REQUESTS: NORMAL

## 2016-07-01 LAB — PROTIME-INR
INR: 1.15
PROTHROMBIN TIME: 14.8 s (ref 11.4–15.2)

## 2016-07-01 LAB — SYNOVIAL CELL COUNT + DIFF, W/ CRYSTALS
CRYSTALS FLUID: NONE SEEN
Eosinophils-Synovial: 0 % (ref 0–1)
LYMPHOCYTES-SYNOVIAL FLD: 3 % (ref 0–20)
Monocyte-Macrophage-Synovial Fluid: 2 % — ABNORMAL LOW (ref 50–90)
Neutrophil, Synovial: 95 % — ABNORMAL HIGH (ref 0–25)
Other Cells-SYN: 0
WBC, SYNOVIAL: 31050 /mm3 — AB (ref 0–200)

## 2016-07-01 LAB — SEDIMENTATION RATE: Sed Rate: 109 mm/hr — ABNORMAL HIGH (ref 0–22)

## 2016-07-01 MED ORDER — HYDROCODONE-ACETAMINOPHEN 5-325 MG PO TABS: 2.0000 | ORAL_TABLET | ORAL | 0 refills | Status: DC | PRN

## 2016-07-01 MED ORDER — LIDOCAINE HCL (PF) 1 % IJ SOLN
5.0000 mL | Freq: Once | INTRAMUSCULAR | Status: DC
  Filled 2016-07-01: qty 5

## 2016-07-01 MED ORDER — IBUPROFEN 600 MG PO TABS: 600.0000 mg | ORAL_TABLET | Freq: Four times a day (QID) | ORAL | 0 refills | Status: DC | PRN

## 2016-07-01 MED ORDER — HYDROCODONE-ACETAMINOPHEN 5-325 MG PO TABS
2.0000 | ORAL_TABLET | Freq: Once | ORAL | Status: AC
  Administered 2016-07-01: 2 via ORAL
  Filled 2016-07-01: qty 2

## 2016-07-01 NOTE — ED Notes (Signed)
EDP at bedside  

## 2016-07-01 NOTE — ED Triage Notes (Signed)
Pt seen at [facility] and dx with Pyogenic arthritis of the R knee. Pt sent for further eval. Onset of symptoms 5 days ago. Pt was seen by PCP on Thursday and placed on abx for cellulitis.

## 2016-07-01 NOTE — Discharge Instructions (Signed)

## 2016-07-01 NOTE — ED Provider Notes (Signed)
[facility] DEPT Provider Note   CSN: [id number] Arrival date & time: 07/01/16  1658     History   Chief Complaint Chief Complaint  Patient presents with  . Knee Pain    HPI Lindsey Phillips is a 65 y.o. female.  HPI  The patient is a 65 year old female who has a prior history of hyperlipidemia but takes only a statin, has had several days of worsening right knee pain associated with swelling and warmth. Initially saw her primary care physician who placed her on cephalexin because of some redness of the knee, that is improved however then he has become more swollen and still remains hot. She has no recent surgeries or injuries or skin compromise, has no history of gout. Was seen at urgent care today and transferred for possibility of septic arthritis evaluation. Patient denies any objective fevers and has no shortness of breath chest pain nausea vomiting diarrhea or rashes to the skin.  Past Medical History:  Diagnosis Date  . Hyperlipidemia     Patient Active Problem List   Diagnosis Date Noted  . Vitamin D deficiency 05/23/2016  . Hyperlipidemia with target LDL less than 100 10/16/2012    Past Surgical History:  Procedure Laterality Date  . ABDOMINAL HYSTERECTOMY      OB History    Gravida Para Term Preterm AB Living   1 1 1          SAB TAB Ectopic Multiple Live Births                   Home Medications    Prior to Admission medications   Medication Sig Start Date End Date Taking? Authorizing Provider  atorvastatin (LIPITOR) 40 MG tablet Take 1 tablet (40 mg total) by mouth daily. 05/24/16  Yes Mary-Margaret Daphine DeutscherMartin, FNP  cephALEXin (KEFLEX) 500 MG capsule Take 1 capsule (500 mg total) by mouth 4 (four) times daily. 06/29/16  Yes Junie Spencerhristy A Hawks, FNP  HYDROcodone-acetaminophen (NORCO/VICODIN) 5-325 MG tablet Take 2 tablets by mouth every 4 (four) hours as needed. 07/01/16   Eber HongBrian Kearie Mennen, MD  ibuprofen (ADVIL,MOTRIN) 600 MG tablet Take 1 tablet (600 mg total) by  mouth every 6 (six) hours as needed. 07/01/16   Eber HongBrian Amram Maya, MD    Family History Family History  Problem Relation Age of Onset  . Hypertension Mother   . Diabetes Mother   . Heart disease Mother   . Kidney disease Mother   . Diabetes Father   . Diabetes Brother   . Hypertension Brother   . Diabetes Brother   . Hypertension Brother     Social History Social History  Substance Use Topics  . Smoking status: Former Smoker    Packs/day: 0.25    Years: 15.00    Types: Cigarettes    Quit date: 07/17/2013  . Smokeless tobacco: Never Used  . Alcohol use No     Allergies   Patient has no known allergies.   Review of Systems Review of Systems  All other systems reviewed and are negative.    Physical Exam Updated Vital Signs BP 123/89 (BP Location: Right Arm)   Pulse 76   Temp 98.1 F (36.7 C) (Oral)   Resp 18   Ht 5\' 9"  (1.753 m)   Wt 198 lb (89.8 kg)   SpO2 100%   BMI 29.24 kg/m   Physical Exam  Constitutional: She appears well-developed and well-nourished. No distress.  HENT:  Head: Normocephalic and atraumatic.  Mouth/Throat: Oropharynx is clear  and moist. No oropharyngeal exudate.  Eyes: Conjunctivae and EOM are normal. Pupils are equal, round, and reactive to light. Right eye exhibits no discharge. Left eye exhibits no discharge. No scleral icterus.  Neck: Normal range of motion. Neck supple. No JVD present. No thyromegaly present.  Cardiovascular: Normal rate, regular rhythm, normal heart sounds and intact distal pulses.  Exam reveals no gallop and no friction rub.   No murmur heard. Pulmonary/Chest: Effort normal and breath sounds normal. No respiratory distress. She has no wheezes. She has no rales.  Abdominal: Soft. Bowel sounds are normal. She exhibits no distension and no mass. There is no tenderness.  Musculoskeletal: She exhibits tenderness and deformity ( clinical effusion). She exhibits no edema.  Dec ROM of the R knee - all other joints normal    Lymphadenopathy:    She has no cervical adenopathy.  Neurological: She is alert. Coordination normal.  Skin: Skin is warm and dry. No rash noted. No erythema.  No rash  Psychiatric: She has a normal mood and affect. Her behavior is normal.  Nursing note and vitals reviewed.    ED Treatments / Results  Labs (all labs ordered are listed, but only abnormal results are displayed) Labs Reviewed  CBC WITH DIFFERENTIAL/PLATELET - Abnormal; Notable for the following:       Result Value   WBC 13.2 (*)    Neutro Abs 9.3 (*)    Monocytes Absolute 1.4 (*)    All other components within normal limits  BASIC METABOLIC PANEL - Abnormal; Notable for the following:    Potassium 3.0 (*)    Glucose, Bld 114 (*)    Creatinine, Ser 1.08 (*)    GFR calc non Af Amer 53 (*)    All other components within normal limits  SYNOVIAL CELL COUNT + DIFF, W/ CRYSTALS - Abnormal; Notable for the following:    Color, Synovial YELLOW (*)    Appearance-Synovial TURBID (*)    WBC, Synovial 31,050 (*)    Neutrophil, Synovial 95 (*)    Monocyte-Macrophage-Synovial Fluid 2 (*)    All other components within normal limits  GRAM STAIN  CULTURE, BODY FLUID-BOTTLE  BODY FLUID CULTURE  PROTIME-INR  SEDIMENTATION RATE  Synovial normally and no abnormalstudies are normal Radiology No results found.  Procedures .Joint Aspiration/Arthrocentesis Date/Time: 07/01/2016 7:06 PM Performed by: Eber HongMILLER, Jihan Mellette Authorized by: Eber HongMILLER, Ohm Dentler   Consent:    Consent obtained:  Written   Consent given by:  Patient   Risks discussed:  Bleeding, infection, pain and incomplete drainage   Alternatives discussed:  No treatment Location:    Location:  Knee   Knee:  R knee Anesthesia (see MAR for exact dosages):    Anesthesia method:  Local infiltration   Local anesthetic:  Lidocaine 1% w/o epi Procedure details:    Preparation: Patient was prepped and draped in usual sterile fashion     Needle gauge:  18 G   Ultrasound  guidance: no     Approach:  Lateral   Aspirate amount:  50 cc   Aspirate characteristics:  Cloudy and yellow   Steroid injected: no     Specimen collected: yes   Post-procedure details:    Dressing:  Sterile dressing   Patient tolerance of procedure:  Tolerated well, no immediate complications   (including critical care time)  Medications Ordered in ED Medications  lidocaine (PF) (XYLOCAINE) 1 % injection 5 mL (not administered)  HYDROcodone-acetaminophen (NORCO/VICODIN) 5-325 MG per tablet 2 tablet (not administered)  Initial Impression / Assessment and Plan / ED Course  I have reviewed the triage vital signs and the nursing notes.  Pertinent labs & imaging results that were available during my care of the patient were reviewed by me and considered in my medical decision making (see chart for details).  Clinical Course    Pt consented for Arthrocentesis. Needs eval for septic arthritis vs gout vs hemarthrosis - does not have cllulitis. Not on thinners  Arthrocentesis went well, Gram stain is negative for organisms, cell count shows 30,000 white blood cells, no crystals, discussed with Dr. Romeo Apple who will see the patient in this upcoming week and suggest that this is treated as an inflammatory arthritis, no fever, no tachycardia, slight leukocytosis. Patient expressed understanding, will call Monday morning for appointment. Well-appearing on discharge.  Final Clinical Impressions(s) / ED Diagnoses   Final diagnoses:  Effusion of right knee    New Prescriptions New Prescriptions   HYDROCODONE-ACETAMINOPHEN (NORCO/VICODIN) 5-325 MG TABLET    Take 2 tablets by mouth every 4 (four) hours as needed.   IBUPROFEN (ADVIL,MOTRIN) 600 MG TABLET    Take 1 tablet (600 mg total) by mouth every 6 (six) hours as needed.     Eber Hong, MD 07/01/16 2132

## 2016-07-02 ENCOUNTER — Inpatient Hospital Stay (HOSPITAL_COMMUNITY)
Admission: EM | Admit: 2016-07-02 | Discharge: 2016-07-04 | DRG: 489 | Disposition: A | Discharge status: Skilled Nursing Facility | Payer: Medicare Other | Attending: Orthopedic Surgery | Admitting: Orthopedic Surgery

## 2016-07-02 ENCOUNTER — Observation Stay (HOSPITAL_COMMUNITY): Payer: Medicare Other

## 2016-07-02 ENCOUNTER — Encounter (HOSPITAL_COMMUNITY): Payer: Self-pay | Admitting: Emergency Medicine

## 2016-07-02 DIAGNOSIS — M1990 Unspecified osteoarthritis, unspecified site: Secondary | ICD-10-CM | POA: Diagnosis present

## 2016-07-02 DIAGNOSIS — Z79899 Other long term (current) drug therapy: Secondary | ICD-10-CM

## 2016-07-02 DIAGNOSIS — M009 Pyogenic arthritis, unspecified: Principal | ICD-10-CM | POA: Diagnosis present

## 2016-07-02 DIAGNOSIS — Z01818 Encounter for other preprocedural examination: Secondary | ICD-10-CM | POA: Diagnosis not present

## 2016-07-02 DIAGNOSIS — Z87891 Personal history of nicotine dependence: Secondary | ICD-10-CM

## 2016-07-02 DIAGNOSIS — R2689 Other abnormalities of gait and mobility: Secondary | ICD-10-CM

## 2016-07-02 DIAGNOSIS — E876 Hypokalemia: Secondary | ICD-10-CM | POA: Diagnosis present

## 2016-07-02 DIAGNOSIS — Z90710 Acquired absence of both cervix and uterus: Secondary | ICD-10-CM

## 2016-07-02 DIAGNOSIS — Z01810 Encounter for preprocedural cardiovascular examination: Secondary | ICD-10-CM

## 2016-07-02 DIAGNOSIS — M25461 Effusion, right knee: Secondary | ICD-10-CM | POA: Diagnosis present

## 2016-07-02 LAB — BASIC METABOLIC PANEL
Anion gap: 9 (ref 5–15)
BUN: 21 mg/dL — AB (ref 6–20)
CALCIUM: 9.5 mg/dL (ref 8.9–10.3)
CO2: 26 mmol/L (ref 22–32)
Chloride: 104 mmol/L (ref 101–111)
Creatinine, Ser: 1.08 mg/dL — ABNORMAL HIGH (ref 0.44–1.00)
GFR calc Af Amer: >60 mL/min (ref >60)
GFR, EST NON AFRICAN AMERICAN: 53 mL/min — AB (ref >60)
GLUCOSE: 117 mg/dL — AB (ref 65–99)
Potassium: 2.8 mmol/L — ABNORMAL LOW (ref 3.5–5.1)
Sodium: 139 mmol/L (ref 135–145)

## 2016-07-02 LAB — CBC WITH DIFFERENTIAL/PLATELET
Basophils Absolute: 0 10*3/uL (ref 0.0–0.1)
Basophils Relative: 0 %
EOS ABS: 0.1 10*3/uL (ref 0.0–0.7)
EOS PCT: 1 %
HCT: 37 % (ref 36.0–46.0)
Hemoglobin: 11.9 g/dL — ABNORMAL LOW (ref 12.0–15.0)
LYMPHS ABS: 2.5 10*3/uL (ref 0.7–4.0)
Lymphocytes Relative: 25 %
MCH: 27.4 pg (ref 26.0–34.0)
MCHC: 32.2 g/dL (ref 30.0–36.0)
MCV: 85.1 fL (ref 78.0–100.0)
MONOS PCT: 10 %
Monocytes Absolute: 1.1 10*3/uL — ABNORMAL HIGH (ref 0.1–1.0)
Neutro Abs: 6.5 10*3/uL (ref 1.7–7.7)
Neutrophils Relative %: 64 %
PLATELETS: 320 10*3/uL (ref 150–400)
RBC: 4.35 MIL/uL (ref 3.87–5.11)
RDW: 15.2 % (ref 11.5–15.5)
WBC: 10.1 10*3/uL (ref 4.0–10.5)

## 2016-07-02 LAB — MAGNESIUM: Magnesium: 2.2 mg/dL (ref 1.7–2.4)

## 2016-07-02 LAB — SEDIMENTATION RATE: SED RATE: 122 mm/h — AB (ref 0–22)

## 2016-07-02 MED ORDER — POVIDONE-IODINE 10 % EX SWAB
2.0000 "application " | Freq: Once | CUTANEOUS | Status: AC
  Administered 2016-07-02: 2 via TOPICAL

## 2016-07-02 MED ORDER — IBUPROFEN 600 MG PO TABS: 600.0000 mg | ORAL_TABLET | Freq: Four times a day (QID) | ORAL | Status: DC | PRN

## 2016-07-02 MED ORDER — OXYCODONE HCL 5 MG PO TABS
5.0000 mg | ORAL_TABLET | ORAL | Status: DC | PRN
  Administered 2016-07-03 – 2016-07-04 (×2): 5 mg via ORAL
  Filled 2016-07-02 (×2): qty 1

## 2016-07-02 MED ORDER — HYDROCODONE-ACETAMINOPHEN 5-325 MG PO TABS
1.0000 | ORAL_TABLET | ORAL | Status: DC | PRN
  Administered 2016-07-03 – 2016-07-04 (×2): 1 via ORAL
  Filled 2016-07-02 (×3): qty 1

## 2016-07-02 MED ORDER — POTASSIUM CHLORIDE CRYS ER 20 MEQ PO TBCR
40.0000 meq | EXTENDED_RELEASE_TABLET | Freq: Four times a day (QID) | ORAL | Status: AC
  Administered 2016-07-02 (×2): 40 meq via ORAL
  Filled 2016-07-02 (×2): qty 2

## 2016-07-02 MED ORDER — CHLORHEXIDINE GLUCONATE 4 % EX LIQD
60.0000 mL | Freq: Once | CUTANEOUS | Status: AC
  Administered 2016-07-02: 4 via TOPICAL
  Filled 2016-07-02: qty 15

## 2016-07-02 MED ORDER — ACETAMINOPHEN 325 MG PO TABS: 650.0000 mg | ORAL_TABLET | Freq: Four times a day (QID) | ORAL | Status: DC | PRN

## 2016-07-02 MED ORDER — ACETAMINOPHEN 650 MG RE SUPP: 650.0000 mg | Freq: Four times a day (QID) | RECTAL | Status: DC | PRN

## 2016-07-02 MED ORDER — DEXTROSE-NACL 5-0.45 % IV SOLN
INTRAVENOUS | Status: AC
  Administered 2016-07-03: via INTRAVENOUS

## 2016-07-02 MED ORDER — ATORVASTATIN CALCIUM 40 MG PO TABS
40.0000 mg | ORAL_TABLET | Freq: Every day | ORAL | Status: DC
  Administered 2016-07-02 – 2016-07-04 (×2): 40 mg via ORAL
  Filled 2016-07-02 (×3): qty 1

## 2016-07-02 MED ORDER — HEPARIN SODIUM (PORCINE) 5000 UNIT/ML IJ SOLN
5000.0000 [IU] | Freq: Three times a day (TID) | INTRAMUSCULAR | Status: AC
  Administered 2016-07-02 – 2016-07-03 (×2): 5000 [IU] via SUBCUTANEOUS
  Filled 2016-07-02 (×2): qty 1

## 2016-07-02 NOTE — Progress Notes (Signed)
Patient ID: Lindsey Phillips, female   DOB: 12/14/1950, 65 y.o.   MRN: [id number] Possible septic knee  Started on the 12/10; treated with keflex,  Yesterday knee aspiration yielded 30K wbc and no organisms ER called patient back in when culture read gm + cocci with no evidence of puncture wound and no risk factors  I rec a prophylactic arthroscopic lavage

## 2016-07-02 NOTE — ED Triage Notes (Signed)
Pt contacted and told to return to the ED after culture results were finished. Pt states she was told her abx needed to be changed.

## 2016-07-02 NOTE — ED Notes (Signed)
RN notified pt and pt's sister that pt should return to ED for antibiotic therapy and further evaluation due to positive fluid cultures per Dr. Rubin PayorPickering.

## 2016-07-02 NOTE — ED Provider Notes (Signed)
[facility] Provider Note   CSN: [id number] Arrival date & time: 07/02/16  1459     History   Chief Complaint Chief Complaint  Patient presents with  . Follow up from 12/16    HPI Lindsey Phillips is a 65 y.o. female.  HPI Patient presents after being told to come back for positive cultures from her knee aspiration yesterday. She had a few day history of pain in her right knee and been started on Keflex by her primary care doctor for presumed cellulitis. She was seen in the ER by Dr. Hyacinth MeekerMiller yesterday and was not felt to have a cellulitis that time. Aspirate was done and had systemic white count of 13 white count in the knee aspirate 35,000 and no organisms on Gram stain. Sedimentation rate was 109. Patient was sent home to follow-up with Dr. Romeo AppleHarrison, with whom Dr. Hyacinth MeekerMiller discussed the patient. However the culture came back with gram positive cocci and patient was told to come back to the ER. She is still having pain in the knee. She is not having systemic fevers. Still has pain with movement. No nausea vomiting or diarrhea. She is not on anticoagulation. She still been taking Keflex.   Past Medical History:  Diagnosis Date  . Hyperlipidemia     Patient Active Problem List   Diagnosis Date Noted  . Septic arthritis (HCC) 07/02/2016  . Vitamin D deficiency 05/23/2016  . Hyperlipidemia with target LDL less than 100 10/16/2012    Past Surgical History:  Procedure Laterality Date  . ABDOMINAL HYSTERECTOMY      OB History    Gravida Para Term Preterm AB Living   1 1 1          SAB TAB Ectopic Multiple Live Births                   Home Medications    Prior to Admission medications   Medication Sig Start Date End Date Taking? Authorizing Provider  atorvastatin (LIPITOR) 40 MG tablet Take 1 tablet (40 mg total) by mouth daily. 05/24/16  Yes Mary-Margaret Daphine DeutscherMartin, FNP  cephALEXin (KEFLEX) 500 MG capsule Take 1 capsule (500 mg total) by mouth 4 (four) times daily.  06/29/16  Yes Junie Spencerhristy A Hawks, FNP  ibuprofen (ADVIL,MOTRIN) 600 MG tablet Take 1 tablet (600 mg total) by mouth every 6 (six) hours as needed. 07/01/16  Yes Eber HongBrian Miller, MD  HYDROcodone-acetaminophen (NORCO/VICODIN) 5-325 MG tablet Take 2 tablets by mouth every 4 (four) hours as needed. 07/01/16   Eber HongBrian Miller, MD    Family History Family History  Problem Relation Age of Onset  . Hypertension Mother   . Diabetes Mother   . Heart disease Mother   . Kidney disease Mother   . Diabetes Father   . Diabetes Brother   . Hypertension Brother   . Diabetes Brother   . Hypertension Brother     Social History Social History  Substance Use Topics  . Smoking status: Former Smoker    Packs/day: 0.25    Years: 15.00    Types: Cigarettes    Quit date: 07/17/2013  . Smokeless tobacco: Never Used  . Alcohol use No     Allergies   Patient has no known allergies.   Review of Systems Review of Systems  Constitutional: Negative for appetite change and fever.  HENT: Negative for congestion.   Cardiovascular: Negative for chest pain.  Gastrointestinal: Negative for abdominal pain.  Musculoskeletal: Positive for gait problem and joint swelling.  Negative for back pain, neck pain and neck stiffness.  Skin: Negative for rash and wound.     Physical Exam Updated Vital Signs BP 121/67 (BP Location: Left Arm)   Pulse 87   Temp 98 F (36.7 C) (Oral)   Resp 16   Ht 5\' 9"  (1.753 m)   Wt (!) 412 lb 4.2 oz (187 kg)   SpO2 100%   BMI 60.88 kg/m   Physical Exam  Constitutional: She appears well-developed.  Eyes: EOM are normal.  Cardiovascular: Normal rate.   Pulmonary/Chest: Effort normal.  Abdominal: Soft.  Musculoskeletal: She exhibits tenderness.  Mild effusion of right knee. Site of previous aspiration on the lateral aspect. Decreased range of motion in the knee. No erythema on the skin. Neurovascular intact in foot.  Skin: Capillary refill takes less than 2 seconds.  Psychiatric:  She has a normal mood and affect.     ED Treatments / Results  Labs (all labs ordered are listed, but only abnormal results are displayed) Labs Reviewed  CBC WITH DIFFERENTIAL/PLATELET - Abnormal; Notable for the following:       Result Value   Hemoglobin 11.9 (*)    Monocytes Absolute 1.1 (*)    All other components within normal limits  SEDIMENTATION RATE - Abnormal; Notable for the following:    Sed Rate 122 (*)    All other components within normal limits  BASIC METABOLIC PANEL - Abnormal; Notable for the following:    Potassium 2.8 (*)    Glucose, Bld 117 (*)    BUN 21 (*)    Creatinine, Ser 1.08 (*)    GFR calc non Af Amer 53 (*)    All other components within normal limits  SURGICAL PCR SCREEN  MAGNESIUM  COMPREHENSIVE METABOLIC PANEL  CBC  PROTIME-INR    EKG  EKG Interpretation None       Radiology No results found.  Procedures Procedures (including critical care time)  Medications Ordered in ED Medications  dextrose 5 %-0.45 % sodium chloride infusion (not administered)  potassium chloride SA (K-DUR,KLOR-CON) CR tablet 40 mEq (40 mEq Oral Given 07/02/16 1817)  HYDROcodone-acetaminophen (NORCO/VICODIN) 5-325 MG per tablet 1 tablet (not administered)  ibuprofen (ADVIL,MOTRIN) tablet 600 mg (not administered)  atorvastatin (LIPITOR) tablet 40 mg (40 mg Oral Given 07/02/16 2029)  heparin injection 5,000 Units (5,000 Units Subcutaneous Given 07/02/16 2030)  acetaminophen (TYLENOL) tablet 650 mg (not administered)    Or  acetaminophen (TYLENOL) suppository 650 mg (not administered)  oxyCODONE (Oxy IR/ROXICODONE) immediate release tablet 5 mg (not administered)  chlorhexidine (HIBICLENS) 4 % liquid 4 application (not administered)  povidone-iodine 10 % swab 2 application (not administered)     Initial Impression / Assessment and Plan / ED Course  I have reviewed the triage vital signs and the nursing notes.  Pertinent labs & imaging results that were  available during my care of the patient were reviewed by me and considered in my medical decision making (see chart for details).  Clinical Course     Patient with positive aspirate of right knee. Discussed with Dr. Romeo AppleHarrison who requested admission of the patient to his service. Will do washout tomorrow. Requested no IV antibiotics this time to get a good culture since it is a somewhat questionable infection since there was no organism on Gram stain. Observation orders placed. Mild hypokalemia was supplemented.  Final Clinical Impressions(s) / ED Diagnoses   Final diagnoses:  Pyogenic arthritis of right knee joint, due to unspecified organism (HCC)  New Prescriptions Current Discharge Medication List       Benjiman Core, MD 07/02/16 2126

## 2016-07-02 NOTE — ED Notes (Signed)
Nurse from 300 requesting potassium order before pt gets to floor. MD Pickering notified.

## 2016-07-02 NOTE — ED Notes (Signed)
Pt reports decreased pain since yesterdays procedure as well as increased ROM. Denies N/V/D, fever.

## 2016-07-02 NOTE — ED Notes (Signed)
Lab called stating pt's culture is postiive for gram positive cocci and to be [facility] work up.

## 2016-07-03 ENCOUNTER — Observation Stay (HOSPITAL_COMMUNITY): Payer: Medicare Other | Admitting: Anesthesiology

## 2016-07-03 ENCOUNTER — Encounter (HOSPITAL_COMMUNITY)
Admission: EM | Disposition: A | Discharge status: Home / Self Care | Payer: Self-pay | Source: Home / Self Care | Attending: Orthopedic Surgery

## 2016-07-03 ENCOUNTER — Encounter (HOSPITAL_COMMUNITY): Payer: Self-pay | Admitting: *Deleted

## 2016-07-03 DIAGNOSIS — Z87891 Personal history of nicotine dependence: Secondary | ICD-10-CM | POA: Diagnosis not present

## 2016-07-03 DIAGNOSIS — M1990 Unspecified osteoarthritis, unspecified site: Secondary | ICD-10-CM | POA: Diagnosis present

## 2016-07-03 DIAGNOSIS — Z90710 Acquired absence of both cervix and uterus: Secondary | ICD-10-CM | POA: Diagnosis not present

## 2016-07-03 DIAGNOSIS — Z79899 Other long term (current) drug therapy: Secondary | ICD-10-CM | POA: Diagnosis not present

## 2016-07-03 DIAGNOSIS — M009 Pyogenic arthritis, unspecified: Secondary | ICD-10-CM | POA: Diagnosis present

## 2016-07-03 DIAGNOSIS — E876 Hypokalemia: Secondary | ICD-10-CM | POA: Diagnosis not present

## 2016-07-03 DIAGNOSIS — M00061 Staphylococcal arthritis, right knee: Secondary | ICD-10-CM

## 2016-07-03 DIAGNOSIS — M25461 Effusion, right knee: Secondary | ICD-10-CM | POA: Diagnosis present

## 2016-07-03 HISTORY — PX: KNEE ARTHROSCOPY: SHX127

## 2016-07-03 LAB — CBC
HEMATOCRIT: 31.4 % — AB (ref 36.0–46.0)
Hemoglobin: 10 g/dL — ABNORMAL LOW (ref 12.0–15.0)
MCH: 27 pg (ref 26.0–34.0)
MCHC: 31.8 g/dL (ref 30.0–36.0)
MCV: 84.6 fL (ref 78.0–100.0)
Platelets: 281 10*3/uL (ref 150–400)
RBC: 3.71 MIL/uL — ABNORMAL LOW (ref 3.87–5.11)
RDW: 15.2 % (ref 11.5–15.5)
WBC: 7.8 10*3/uL (ref 4.0–10.5)

## 2016-07-03 LAB — COMPREHENSIVE METABOLIC PANEL
ALBUMIN: 2.5 g/dL — AB (ref 3.5–5.0)
ALK PHOS: 112 U/L (ref 38–126)
ALT: 36 U/L (ref 14–54)
AST: 42 U/L — AB (ref 15–41)
Anion gap: 7 (ref 5–15)
BUN: 16 mg/dL (ref 6–20)
CALCIUM: 8.9 mg/dL (ref 8.9–10.3)
CO2: 25 mmol/L (ref 22–32)
Chloride: 107 mmol/L (ref 101–111)
Creatinine, Ser: 0.76 mg/dL (ref 0.44–1.00)
GFR calc Af Amer: >60 mL/min (ref >60)
GFR calc non Af Amer: >60 mL/min (ref >60)
GLUCOSE: 149 mg/dL — AB (ref 65–99)
POTASSIUM: 3.4 mmol/L — AB (ref 3.5–5.1)
Sodium: 139 mmol/L (ref 135–145)
TOTAL PROTEIN: 6.8 g/dL (ref 6.5–8.1)
Total Bilirubin: 0.6 mg/dL (ref 0.3–1.2)

## 2016-07-03 LAB — SURGICAL PCR SCREEN
MRSA, PCR: NEGATIVE
Staphylococcus aureus: NEGATIVE

## 2016-07-03 LAB — PROTIME-INR
INR: 1.15
Prothrombin Time: 14.8 seconds (ref 11.4–15.2)

## 2016-07-03 SURGERY — ARTHROSCOPY, KNEE
Anesthesia: General | Site: Knee | Laterality: Right

## 2016-07-03 MED ORDER — EPINEPHRINE PF 1 MG/ML IJ SOLN
INTRAMUSCULAR | Status: AC
  Filled 2016-07-03: qty 2

## 2016-07-03 MED ORDER — FENTANYL CITRATE (PF) 100 MCG/2ML IJ SOLN
INTRAMUSCULAR | Status: AC
  Filled 2016-07-03: qty 2

## 2016-07-03 MED ORDER — LACTATED RINGERS IV SOLN
INTRAVENOUS | Status: DC
  Administered 2016-07-03: 11:00:00 via INTRAVENOUS

## 2016-07-03 MED ORDER — CEFAZOLIN SODIUM-DEXTROSE 2-4 GM/100ML-% IV SOLN
2.0000 g | Freq: Three times a day (TID) | INTRAVENOUS | Status: DC
  Administered 2016-07-04: 2 g via INTRAVENOUS
  Filled 2016-07-03 (×6): qty 100

## 2016-07-03 MED ORDER — ONDANSETRON HCL 4 MG/2ML IJ SOLN
INTRAMUSCULAR | Status: AC
  Filled 2016-07-03: qty 2

## 2016-07-03 MED ORDER — SEVOFLURANE IN SOLN
RESPIRATORY_TRACT | Status: AC
  Filled 2016-07-03: qty 250

## 2016-07-03 MED ORDER — MIDAZOLAM HCL 2 MG/2ML IJ SOLN
INTRAMUSCULAR | Status: AC
  Filled 2016-07-03: qty 2

## 2016-07-03 MED ORDER — BUPIVACAINE-EPINEPHRINE (PF) 0.25% -1:200000 IJ SOLN
INTRAMUSCULAR | Status: DC | PRN
  Administered 2016-07-03: 60 mL

## 2016-07-03 MED ORDER — EPHEDRINE SULFATE 50 MG/ML IJ SOLN
INTRAMUSCULAR | Status: AC
  Filled 2016-07-03: qty 1

## 2016-07-03 MED ORDER — CEFAZOLIN SODIUM-DEXTROSE 2-4 GM/100ML-% IV SOLN
INTRAVENOUS | Status: AC
  Filled 2016-07-03: qty 200

## 2016-07-03 MED ORDER — MIDAZOLAM HCL 2 MG/2ML IJ SOLN
1.0000 mg | INTRAMUSCULAR | Status: DC | PRN
  Administered 2016-07-03: 2 mg via INTRAVENOUS

## 2016-07-03 MED ORDER — EPINEPHRINE PF 1 MG/ML IJ SOLN
INTRAMUSCULAR | Status: AC
  Filled 2016-07-03: qty 3

## 2016-07-03 MED ORDER — PROPOFOL 10 MG/ML IV BOLUS
INTRAVENOUS | Status: DC | PRN
Start: 2016-07-03 — End: 2016-07-03
  Administered 2016-07-03: 120 mg via INTRAVENOUS
  Administered 2016-07-03: 20 mg via INTRAVENOUS
  Administered 2016-07-03 (×2): 30 mg via INTRAVENOUS

## 2016-07-03 MED ORDER — HYDROMORPHONE HCL 1 MG/ML IJ SOLN
0.2500 mg | INTRAMUSCULAR | Status: DC | PRN
  Administered 2016-07-03: 0.5 mg via INTRAVENOUS

## 2016-07-03 MED ORDER — PHENYLEPHRINE 40 MCG/ML (10ML) SYRINGE FOR IV PUSH (FOR BLOOD PRESSURE SUPPORT)
PREFILLED_SYRINGE | INTRAVENOUS | Status: AC
  Filled 2016-07-03: qty 10

## 2016-07-03 MED ORDER — PROPOFOL 10 MG/ML IV BOLUS
INTRAVENOUS | Status: AC
  Filled 2016-07-03: qty 20

## 2016-07-03 MED ORDER — ONDANSETRON HCL 4 MG/2ML IJ SOLN
4.0000 mg | Freq: Once | INTRAMUSCULAR | Status: AC
  Administered 2016-07-03: 4 mg via INTRAVENOUS

## 2016-07-03 MED ORDER — LIDOCAINE HCL (CARDIAC) 20 MG/ML IV SOLN
INTRAVENOUS | Status: DC | PRN
  Administered 2016-07-03: 40 mg via INTRAVENOUS

## 2016-07-03 MED ORDER — LIDOCAINE HCL (PF) 1 % IJ SOLN
INTRAMUSCULAR | Status: AC
  Filled 2016-07-03: qty 5

## 2016-07-03 MED ORDER — BUPIVACAINE-EPINEPHRINE (PF) 0.25% -1:200000 IJ SOLN
INTRAMUSCULAR | Status: AC
  Filled 2016-07-03: qty 60

## 2016-07-03 MED ORDER — SODIUM CHLORIDE 0.9 % IJ SOLN
INTRAMUSCULAR | Status: AC
  Filled 2016-07-03: qty 10

## 2016-07-03 MED ORDER — SODIUM CHLORIDE 0.9 % IR SOLN
Status: DC | PRN
  Administered 2016-07-03 (×2): 3000 mL

## 2016-07-03 MED ORDER — SODIUM CHLORIDE 0.9 % IR SOLN
Status: DC | PRN
  Administered 2016-07-03: 1000 mL

## 2016-07-03 MED ORDER — POTASSIUM CHLORIDE 10 MEQ/100ML IV SOLN
10.0000 meq | INTRAVENOUS | Status: AC
  Filled 2016-07-03: qty 100

## 2016-07-03 MED ORDER — FENTANYL CITRATE (PF) 100 MCG/2ML IJ SOLN
INTRAMUSCULAR | Status: DC | PRN
  Administered 2016-07-03 (×8): 25 ug via INTRAVENOUS

## 2016-07-03 MED ORDER — HYDROMORPHONE HCL 1 MG/ML IJ SOLN
INTRAMUSCULAR | Status: AC
  Filled 2016-07-03: qty 0.5

## 2016-07-03 MED ORDER — SODIUM CHLORIDE 0.9 % IV SOLN: 30.0000 meq | Freq: Once | INTRAVENOUS | Status: DC

## 2016-07-03 MED ORDER — FENTANYL CITRATE (PF) 100 MCG/2ML IJ SOLN
25.0000 ug | INTRAMUSCULAR | Status: AC | PRN
  Administered 2016-07-03 (×2): 25 ug via INTRAVENOUS

## 2016-07-03 SURGICAL SUPPLY — 52 items
ARTHROWAND PARAGON T2 (SURGICAL WAND)
BAG HAMPER (MISCELLANEOUS) ×3 IMPLANT
BANDAGE ELASTIC 6 LF NS (GAUZE/BANDAGES/DRESSINGS) ×3 IMPLANT
BANDAGE ELASTIC 6 VELCRO NS (GAUZE/BANDAGES/DRESSINGS) ×3 IMPLANT
BLADE AGGRESSIVE PLUS 4.0 (BLADE) ×3 IMPLANT
BLADE SURG SZ11 CARB STEEL (BLADE) ×3 IMPLANT
BNDG CMPR MED 5X6 ELC HKLP NS (GAUZE/BANDAGES/DRESSINGS) ×1
CHLORAPREP W/TINT 26ML (MISCELLANEOUS) ×3 IMPLANT
CLOTH BEACON ORANGE TIMEOUT ST (SAFETY) ×3 IMPLANT
COOLER CRYO IC GRAV AND TUBE (ORTHOPEDIC SUPPLIES) ×3 IMPLANT
CUFF CRYO KNEE18X23 MED (MISCELLANEOUS) ×3 IMPLANT
CUFF TOURNIQUET SINGLE 34IN LL (TOURNIQUET CUFF) ×3 IMPLANT
DECANTER SPIKE VIAL GLASS SM (MISCELLANEOUS) ×6 IMPLANT
DRESSING XEROFORM 5X9 (GAUZE/BANDAGES/DRESSINGS) ×3 IMPLANT
GAUZE SPONGE 4X4 12PLY STRL (GAUZE/BANDAGES/DRESSINGS) ×3 IMPLANT
GAUZE SPONGE 4X4 16PLY XRAY LF (GAUZE/BANDAGES/DRESSINGS) ×3 IMPLANT
GAUZE XEROFORM 5X9 LF (GAUZE/BANDAGES/DRESSINGS) ×3 IMPLANT
GLOVE BIOGEL PI IND STRL 7.0 (GLOVE) ×2 IMPLANT
GLOVE BIOGEL PI INDICATOR 7.0 (GLOVE) ×4
GLOVE ECLIPSE 6.5 STRL STRAW (GLOVE) ×6 IMPLANT
GLOVE ECLIPSE 8.0 STRL XLNG CF (GLOVE) ×3 IMPLANT
GLOVE SKINSENSE NS SZ8.0 LF (GLOVE) ×2
GLOVE SKINSENSE STRL SZ8.0 LF (GLOVE) ×1 IMPLANT
GLOVE SS N UNI LF 8.5 STRL (GLOVE) ×3 IMPLANT
GOWN STRL REUS W/TWL LRG LVL3 (GOWN DISPOSABLE) ×6 IMPLANT
GOWN STRL REUS W/TWL XL LVL3 (GOWN DISPOSABLE) ×3 IMPLANT
HLDR LEG FOAM (MISCELLANEOUS) ×1 IMPLANT
IV NS IRRIG 3000ML ARTHROMATIC (IV SOLUTION) ×6 IMPLANT
KIT BLADEGUARD II DBL (SET/KITS/TRAYS/PACK) ×3 IMPLANT
KIT ROOM TURNOVER AP CYSTO (KITS) ×3 IMPLANT
LEG HOLDER FOAM (MISCELLANEOUS) ×2
MANIFOLD NEPTUNE II (INSTRUMENTS) ×3 IMPLANT
MARKER SKIN DUAL TIP RULER LAB (MISCELLANEOUS) ×3 IMPLANT
NEEDLE HYPO 18GX1.5 BLUNT FILL (NEEDLE) ×3 IMPLANT
NEEDLE HYPO 21X1.5 SAFETY (NEEDLE) ×3 IMPLANT
NEEDLE SPNL 18GX3.5 QUINCKE PK (NEEDLE) ×3 IMPLANT
NS IRRIG 1000ML POUR BTL (IV SOLUTION) ×3 IMPLANT
PACK ARTHRO LIMB DRAPE STRL (MISCELLANEOUS) ×3 IMPLANT
PAD ABD 5X9 TENDERSORB (GAUZE/BANDAGES/DRESSINGS) ×3 IMPLANT
PAD ARMBOARD 7.5X6 YLW CONV (MISCELLANEOUS) ×3 IMPLANT
PADDING CAST COTTON 6X4 STRL (CAST SUPPLIES) ×3 IMPLANT
PADDING WEBRIL 6 STERILE (GAUZE/BANDAGES/DRESSINGS) ×3 IMPLANT
SET ARTHROSCOPY INST (INSTRUMENTS) ×3 IMPLANT
SET ARTHROSCOPY PUMP TUBE (IRRIGATION / IRRIGATOR) ×3 IMPLANT
SET BASIN LINEN APH (SET/KITS/TRAYS/PACK) ×3 IMPLANT
SUT ETHILON 3 0 FSL (SUTURE) ×3 IMPLANT
SWAB CULTURE LIQ STUART DBL (MISCELLANEOUS) ×3 IMPLANT
SYR 30ML LL (SYRINGE) ×3 IMPLANT
SYRINGE 10CC LL (SYRINGE) ×3 IMPLANT
TUBE ANAEROBIC PORT A CUL  W/M (MISCELLANEOUS) ×3 IMPLANT
WAND ARTHRO PARAGON T2 (SURGICAL WAND) IMPLANT
YANKAUER SUCT BULB TIP 10FT TU (MISCELLANEOUS) ×9 IMPLANT

## 2016-07-03 NOTE — Brief Op Note (Signed)
07/02/2016 - 07/03/2016  12:57 PM  PATIENT:  Lindsey Phillips  65 y.o. female  PRE-OPERATIVE DIAGNOSIS:  right knee effusion, septic joint  POST-OPERATIVE DIAGNOSIS:  right knee effusion, septic joint  PROCEDURE:  Procedure(s): RIGHT KNEE ARTHROSCOPY (Right)  SURGEON:  Surgeon(s) and Role:    * Vickki HearingStanley E Harrison, MD - Primary  PHYSICIAN ASSISTANT:   ASSISTANTS: none   ANESTHESIA:   regional  EBL:  Total I/O In: 800 [I.V.:800] Out: 0   BLOOD ADMINISTERED:none  DRAINS: none   LOCAL MEDICATIONS USED:  MARCAINE     SPECIMEN:  No Specimen  DISPOSITION OF SPECIMEN:  N/A  COUNTS:  YES  TOURNIQUET:    DICTATION: .Dragon Dictation  PLAN OF CARE: Admit to inpatient   PATIENT DISPOSITION:  PACU - hemodynamically stable.   Delay start of Pharmacological VTE agent (>24hrs) due to surgical blood loss or risk of bleeding: yes  Details of procedure Lindsey Phillips was identified in the preop area the right knee was confirmed and marked for surgery thorough chart review was completed and consent was signed  Patient was taken to surgery had general anesthesia. She was in the supine position. The right leg was in the arthroscopic leg holder and the left leg was padded in a well leg holder.  Sterile prep and drape was performed. Timeout was completed.  Lateral portal was made and the scope was placed into the joint. The medial portal was established and the arthroscopic shaver was placed in the joint.  A thorough evaluation of the knee joint was performed. The medial meniscus was normal grade 1 arthritis in the medial compartment  Anterior cruciate ligament PCL intact with synovial inflammation. Lateral meniscus intact mild grade 1 arthritis lateral compartment.  Patellofemoral joint showed synovial tissue which was injected with hemorrhagic appearance. Some adhesions had formed there and they were removed with the shaver  The medial and lateral gutters were also inflamed with  injected synovium.  I did place the joint through the notch into the back of the knee and irrigated there.  Upon entry into the joint gross purulent material was noted.  9 L of fluid were run through the joint adhesions were debrided as stated.  Portals were closed with 3-0 nylon suture and core percent Marcaine with epinephrine 60 mL injected into the joint sterile dressing was applied  Postop plan patient will be reevaluated at the end of the day for possible discharge if not we'll discharge tomorrow  Culture specific antibiotics will be started once that is obtained  Weightbearing as tolerated with crutches and/or walker plus knee brace will be instituted as well.  [phone]

## 2016-07-03 NOTE — Care Management Note (Addendum)
Case Management Note  Patient Details  Name: Lindsey Phillips MRN: [id number] Date of Birth: 02/28/1951  Subjective/Objective:   Patient adm from home with septic arthritis of right knee. Ind with ADL's PTA.              Action/Plan: Plans to stay with sister at time of discharge. She has a cane at home if needed. Will follow for any CM needs.   Later entry: [facility] Offered choice. [facility] will obtain order from chart. [place] [facility] [place] make [place]. Per patient, [place] [facility]   Expected Discharge Date:  07/05/16               Expected Discharge Plan:  Home/Self Care  In-House Referral:  NA  Discharge planning Services  CM Consult  Post Acute Care Choice:    Choice offered to:     DME Arranged:    DME Agency:     HH Arranged:    HH Agency:     Status of Service:  In process, will continue to follow  If discussed at Long Length of Stay Meetings, dates discussed:    Additional Comments:  [name], Chrystine OilerSharley Diane, RN 07/03/2016, 9:35 AM

## 2016-07-03 NOTE — Evaluation (Signed)
Physical Therapy Evaluation Patient Details Name: Lindsey Phillips MRN: [id number] DOB: 05/01/1951 Today's Date: 07/03/2016   History of Present Illness  65 year old female [facility]  [facility] with severe swelling of her right knee and severe nonradiating diffuse aching pain. X-rays were negative she was started on Keflex for possible septic arthritis. She came to the ER Saturday on the 16th had aspiration which showed 30,000 cells no organisms no crystals and was advised to follow-up in the office. However, culture came back gram-positive cocci and she was called back to the ER and admitted for further treatment and management.  Dx: R knee effusion/septic joint, and she is now s/p R knee arthroscopy 07/03/2016.  Clinical Impression  Pt received in bed, sister, and brother both present in the room, and pt is agreeable to PT evaluation.  Pt states that for the past week she has been using a cane for ambulation due to pain, however prior to that she was not using any DME.  She was independent with all ADL's, and driving prior to admission.  During PT evaluation she was Mod (I) for supine<>sit, and Mod (I) for transfer sit<>stand.  She was then able to ambulate a total of 16350ft with RW, KI donned and Mod (I), as well as negotiate 1 step with Min guard.  Pt and sister were also educated on how to use the ice pack.  At this point, she demonstrates safe household mobility, and is cleared to d/c to her sisters house with HHPT to ensure continued strengthening and endurance with gait.     Follow Up Recommendations Home health PT    Equipment Recommendations  Other (comment) (Pt states she will be borrowing her sisters RW and BSC. )    Recommendations for Other Services       Precautions / Restrictions Precautions Precautions: Fall Precaution Comments: Due to recent surgery with new KI to be worn when ambulating.  Required Braces or Orthoses: Knee Immobilizer - Right Knee Immobilizer -  Right: On when out of bed or walking Restrictions Weight Bearing Restrictions: No      Mobility  Bed Mobility Overal bed mobility: Needs Assistance Bed Mobility: Supine to Sit     Supine to sit: Modified independent (Device/Increase time) (increased time)        Transfers Overall transfer level: Needs assistance Equipment used: Rolling walker (2 wheeled) Transfers: Sit to/from Stand Sit to Stand: Modified independent (Device/Increase time)         General transfer comment: vc's for hand placement.  Pt was able to ambulate into the bathroom with assistance to void.    Ambulation/Gait Ambulation/Gait assistance: Modified independent (Device/Increase time);Min guard Ambulation Distance (Feet): 150 Feet Assistive device: Rolling walker (2 wheeled) Gait Pattern/deviations: Step-to pattern     General Gait Details: decreased cadence due to increased effort of gait with KI donned.    Stairs Stairs: Yes Stairs assistance: Min guard Stair Management: Two rails;Step to pattern;Forwards Number of Stairs: 1 General stair comments: Sister present for stair training.   Wheelchair Mobility    Modified Rankin (Stroke Patients Only)       Balance Overall balance assessment: Modified Independent                                           Pertinent Vitals/Pain Pain Assessment: 0-10 Pain Score: 5  Pain Location: R knee  Pain  Descriptors / Indicators: Aching Pain Intervention(s): Limited activity within patient's tolerance;Monitored during session;Repositioned    Home Living   Living Arrangements: Alone (pt is going to be staying with her sister. )   Type of Home:  (modular home) Home Access: Stairs to enter   Entrance Stairs-Number of Steps: 3-4 steps, but 1 step to get up into sisters house.  Home Layout: One level Home Equipment: Cane - single point Additional Comments: pt would be able to borrow a RW, and BSC if she needs it.      Prior  Function Level of Independence: Independent with assistive device(s)   Gait / Transfers Assistance Needed: Pt was using the cane for ambulation.   ADL's / Homemaking Assistance Needed: independent, driving.          Hand Dominance        Extremity/Trunk Assessment   Upper Extremity Assessment Upper Extremity Assessment: Overall WFL for tasks assessed    Lower Extremity Assessment Lower Extremity Assessment: RLE deficits/detail RLE Deficits / Details: Grossly 3/5    Cervical / Trunk Assessment Cervical / Trunk Assessment: Kyphotic  Communication   Communication: No difficulties  Cognition Arousal/Alertness: Awake/alert Behavior During Therapy: WFL for tasks assessed/performed Overall Cognitive Status: Within Functional Limits for tasks assessed                      General Comments      Exercises     Assessment/Plan    PT Assessment Patient needs continued PT services  PT Problem List Decreased strength;Decreased activity tolerance;Decreased balance;Decreased mobility;Decreased safety awareness;Decreased knowledge of precautions;Decreased knowledge of use of DME;Obesity;Pain          PT Treatment Interventions DME instruction;Gait training;Functional mobility training;Stair training;Therapeutic activities;Therapeutic exercise;Balance training;Patient/family education    PT Goals (Current goals can be found in the Care Plan section)  Acute Rehab PT Goals Patient Stated Goal: Pt wants to go home.  PT Goal Formulation: With patient Time For Goal Achievement: 07/10/16 Potential to Achieve Goals: Good    Frequency 7X/week   Barriers to discharge        Co-evaluation               End of Session Equipment Utilized During Treatment: Gait belt Activity Tolerance: Patient tolerated treatment well Patient left: in chair;with call bell/phone within reach;with family/visitor present;with nursing/sitter in room Nurse Communication: Mobility status  Misty Stanley(Lisa, RN notified of pt's mobility status, as well as location.  Mobilty sheet left up in the room. )    Functional Assessment Tool Used: [employer] AM-PAC "6-clicks"  Functional Limitation: Mobility: Walking and moving around Mobility: Walking and Moving Around Current Status [phone]([id number]): At least 20 percent but less than 40 percent impaired, limited or restricted Mobility: Walking and Moving Around Goal Status [phone]([id number]): At least 1 percent but less than 20 percent impaired, limited or restricted    Time: 1459-1546 PT Time Calculation (min) (ACUTE ONLY): 47 min   Charges:   PT Evaluation $PT Eval Low Complexity: 1 Procedure PT Treatments $Gait Training: 8-22 mins $Therapeutic Activity: 8-22 mins   PT G Codes:   PT G-Codes **NOT FOR INPATIENT CLASS** Functional Assessment Tool Used: [employer] AM-PAC "6-clicks"  Functional Limitation: Mobility: Walking and moving around Mobility: Walking and Moving Around Current Status [phone]([phone]): At least 20 percent but less than 40 percent impaired, limited or restricted Mobility: Walking and Moving Around Goal Status [phone]([phone]): At least 1 percent but less than 20 percent impaired, limited or restricted  Beth Alysse Rathe, PT, DPT X: [phone]

## 2016-07-03 NOTE — Anesthesia Procedure Notes (Signed)
Procedure Name: LMA Insertion Date/Time: 07/03/2016 12:09 PM Performed by: Pernell DupreADAMS, Shammara Jarrett A Pre-anesthesia Checklist: Patient identified, Emergency Drugs available, Suction available, Patient being monitored and Timeout performed Patient Re-evaluated:Patient Re-evaluated prior to inductionOxygen Delivery Method: Circle system utilized Preoxygenation: Pre-oxygenation with 100% oxygen Intubation Type: IV induction Ventilation: Mask ventilation without difficulty LMA Size: 4.0 Number of attempts: 2 Placement Confirmation: positive ETCO2 and breath sounds checked- equal and bilateral Tube secured with: Tape Dental Injury: Teeth and Oropharynx as per pre-operative assessment

## 2016-07-03 NOTE — Progress Notes (Signed)
Patient ID: [name], female   DOB: 04/20/1951, 65 y.o.   MRN: [id number] Post op check   Gram stain from 12-16 staph aureus   Operative cultures today pending   Start ancef 2 gm q 8

## 2016-07-03 NOTE — Anesthesia Postprocedure Evaluation (Signed)
Anesthesia Post Note  Patient: Lindsey Phillips  Procedure(s) Performed: Procedure(s) (LRB): RIGHT KNEE ARTHROSCOPY (Right)  Patient location during evaluation: PACU Anesthesia Type: General Level of consciousness: awake Vital Signs Assessment: post-procedure vital signs reviewed and stable Respiratory status: spontaneous breathing Cardiovascular status: stable Anesthetic complications: no     Last Vitals:  Vitals:   07/03/16 1315 07/03/16 1330  BP: (!) 151/77 (!) 168/75  Pulse: 90 93  Resp: (!) 21 20  Temp:      Last Pain:  Vitals:   07/03/16 1330  TempSrc:   PainSc: Asleep    LLE Motor Response: Purposeful movement;Responds to commands;No tremor (07/03/16 1330) LLE Sensation: Full sensation;No pain (07/03/16 1330)   RLE Sensation: Pain (07/03/16 1330)      Minerva AreolaYATES,Melitza Metheny

## 2016-07-03 NOTE — H&P (Signed)
Lindsey Phillips is an 65 y.o. female.    Chief Complaint: Right knee pain swelling 8 days  HPI: 65 year old female presented to  [facility] with severe swelling of her right knee and severe nonradiating diffuse aching pain. X-rays were negative she was started on Keflex for possible septic arthritis. She came to the ER Saturday on the 16th had aspiration which showed 30,000 cells no organisms no crystals and was advised to follow-up in the office. However, culture came back gram-positive cocci and she was called back to the ER and admitted for further treatment and management    Past Medical History:  Diagnosis Date  . Hyperlipidemia     Past Surgical History:  Procedure Laterality Date  . ABDOMINAL HYSTERECTOMY      Family History  Problem Relation Age of Onset  . Hypertension Mother   . Diabetes Mother   . Heart disease Mother   . Kidney disease Mother   . Diabetes Father   . Diabetes Brother   . Hypertension Brother   . Diabetes Brother   . Hypertension Brother    Social History:  reports that she quit smoking about 2 years ago. Her smoking use included Cigarettes. She has a 3.75 pack-year smoking history. She has never used smokeless tobacco. She reports that she does not drink alcohol or use drugs.  Allergies: No Known Allergies  Medications Prior to Admission  Medication Sig Dispense Refill  . atorvastatin (LIPITOR) 40 MG tablet Take 1 tablet (40 mg total) by mouth daily. 90 tablet 1  . cephALEXin (KEFLEX) 500 MG capsule Take 1 capsule (500 mg total) by mouth 4 (four) times daily. 42 capsule 0  . ibuprofen (ADVIL,MOTRIN) 600 MG tablet Take 1 tablet (600 mg total) by mouth every 6 (six) hours as needed. 30 tablet 0  . HYDROcodone-acetaminophen (NORCO/VICODIN) 5-325 MG tablet Take 2 tablets by mouth every 4 (four) hours as needed. 10 tablet 0    Results for orders placed or performed during the hospital encounter of 07/02/16 (from the past 48 hour(s))  CBC  with Differential     Status: Abnormal   Collection Time: 07/02/16  4:00 PM  Result Value Ref Range   WBC 10.1 4.0 - 10.5 K/uL   RBC 4.35 3.87 - 5.11 MIL/uL   Hemoglobin 11.9 (L) 12.0 - 15.0 g/dL   HCT 37.0 36.0 - 46.0 %   MCV 85.1 78.0 - 100.0 fL   MCH 27.4 26.0 - 34.0 pg   MCHC 32.2 30.0 - 36.0 g/dL   RDW 15.2 11.5 - 15.5 %   Platelets 320 150 - 400 K/uL   Neutrophils Relative % 64 %   Neutro Abs 6.5 1.7 - 7.7 K/uL   Lymphocytes Relative 25 %   Lymphs Abs 2.5 0.7 - 4.0 K/uL   Monocytes Relative 10 %   Monocytes Absolute 1.1 (H) 0.1 - 1.0 K/uL   Eosinophils Relative 1 %   Eosinophils Absolute 0.1 0.0 - 0.7 K/uL   Basophils Relative 0 %   Basophils Absolute 0.0 0.0 - 0.1 K/uL  Sedimentation rate     Status: Abnormal   Collection Time: 07/02/16  4:00 PM  Result Value Ref Range   Sed Rate 122 (H) 0 - 22 mm/hr  Basic metabolic panel     Status: Abnormal   Collection Time: 07/02/16  4:00 PM  Result Value Ref Range   Sodium 139 135 - 145 mmol/L   Potassium 2.8 (L) 3.5 - 5.1 mmol/L  Chloride 104 101 - 111 mmol/L   CO2 26 22 - 32 mmol/L   Glucose, Bld 117 (H) 65 - 99 mg/dL   BUN 21 (H) 6 - 20 mg/dL   Creatinine, Ser 1.08 (H) 0.44 - 1.00 mg/dL   Calcium 9.5 8.9 - 10.3 mg/dL   GFR calc non Af Amer 53 (L) >60 mL/min   GFR calc Af Amer >60 >60 mL/min    Comment: (NOTE) The eGFR has been calculated using the CKD EPI equation. This calculation has not been validated in all clinical situations. eGFR's persistently <60 mL/min signify possible Chronic Kidney Disease.    Anion gap 9 5 - 15  Magnesium     Status: None   Collection Time: 07/02/16  4:00 PM  Result Value Ref Range   Magnesium 2.2 1.7 - 2.4 mg/dL  Surgical pcr screen     Status: None   Collection Time: 07/02/16 10:52 PM  Result Value Ref Range   MRSA, PCR NEGATIVE NEGATIVE   Staphylococcus aureus NEGATIVE NEGATIVE    Comment:        The Xpert SA Assay (FDA approved for NASAL specimens in patients over 21 years of  age), is one component of a comprehensive surveillance program.  Test performance has been validated by [facility] for patients greater than or equal to 34 year old. It is not intended to diagnose infection nor to guide or monitor treatment.   Comprehensive metabolic panel     Status: Abnormal   Collection Time: 07/03/16  5:10 AM  Result Value Ref Range   Sodium 139 135 - 145 mmol/L   Potassium 3.4 (L) 3.5 - 5.1 mmol/L    Comment: DELTA CHECK NOTED   Chloride 107 101 - 111 mmol/L   CO2 25 22 - 32 mmol/L   Glucose, Bld 149 (H) 65 - 99 mg/dL   BUN 16 6 - 20 mg/dL   Creatinine, Ser 0.76 0.44 - 1.00 mg/dL   Calcium 8.9 8.9 - 10.3 mg/dL   Total Protein 6.8 6.5 - 8.1 g/dL   Albumin 2.5 (L) 3.5 - 5.0 g/dL   AST 42 (H) 15 - 41 U/L   ALT 36 14 - 54 U/L   Alkaline Phosphatase 112 38 - 126 U/L   Total Bilirubin 0.6 0.3 - 1.2 mg/dL   GFR calc non Af Amer >60 >60 mL/min   GFR calc Af Amer >60 >60 mL/min    Comment: (NOTE) The eGFR has been calculated using the CKD EPI equation. This calculation has not been validated in all clinical situations. eGFR's persistently <60 mL/min signify possible Chronic Kidney Disease.    Anion gap 7 5 - 15  CBC     Status: Abnormal   Collection Time: 07/03/16  5:10 AM  Result Value Ref Range   WBC 7.8 4.0 - 10.5 K/uL   RBC 3.71 (L) 3.87 - 5.11 MIL/uL   Hemoglobin 10.0 (L) 12.0 - 15.0 g/dL   HCT 31.4 (L) 36.0 - 46.0 %   MCV 84.6 78.0 - 100.0 fL   MCH 27.0 26.0 - 34.0 pg   MCHC 31.8 30.0 - 36.0 g/dL   RDW 15.2 11.5 - 15.5 %   Platelets 281 150 - 400 K/uL  Protime-INR     Status: None   Collection Time: 07/03/16  5:10 AM  Result Value Ref Range   Prothrombin Time 14.8 11.4 - 15.2 seconds   INR 1.15    Portable Chest 1 View  Result Date: 07/03/2016  CLINICAL DATA:  65 year old female with preop radiograph. EXAM: PORTABLE CHEST 1 VIEW COMPARISON:  Chest radiograph dated 11/18/2015 FINDINGS: The lungs are clear. There is no pleural effusion or  pneumothorax. The cardiac silhouette is within normal limits with no acute osseous pathology. IMPRESSION: No active disease. Electronically Signed   By: Anner Crete M.D.   On: 07/03/2016 05:20    Review of Systems  Constitutional: Negative for fever.  HENT: Negative.   Eyes: Negative.   Respiratory: Negative.   Cardiovascular: Negative.   Gastrointestinal: Negative.   Genitourinary: Negative.   Musculoskeletal: Negative.   Skin: Negative for rash.  Neurological: Negative.   Endo/Heme/Allergies: Negative.   Psychiatric/Behavioral: Negative.     Blood pressure 110/60, pulse 80, temperature 98.8 F (37.1 C), temperature source Oral, resp. rate 16, height 5' 9" (1.753 m), weight (!) 412 lb 4.2 oz (187 kg), SpO2 100 %. Physical Exam  Constitutional: She is oriented to person, place, and time. She appears well-nourished.  Eyes: Right eye exhibits no discharge. Left eye exhibits no discharge. No scleral icterus.  Neck: Neck supple. No JVD present. No tracheal deviation present.  Cardiovascular: Intact distal pulses.   Respiratory: Effort normal. No stridor.  GI: Soft. She exhibits no distension.  Neurological: She is alert and oriented to person, place, and time. She has normal reflexes. She exhibits normal muscle tone. Coordination normal.  Skin: Skin is warm and dry. No rash noted. No erythema. No pallor.  Psychiatric: She has a normal mood and affect. Her behavior is normal. Thought content normal.    She has normal range of motion stability strength and alignment without tremor or atrophy subluxation in both left and right upper extremity as well as left lower extremity  Her right knee is swollen and warm to touch she only has 30 of knee bend although alignment is normal there is no rash erythema noted. Ligaments are stable. No contracture subluxation atrophy or tremors noted.  Assessment/Plan Plain x-rays show medial compartment arthrosis with moderate narrowing  Lab  results INR is 1.15 CBC Latest Ref Rng & Units 07/03/2016 07/02/2016 07/01/2016  WBC 4.0 - 10.5 K/uL 7.8 10.1 13.2(H)  Hemoglobin 12.0 - 15.0 g/dL 10.0(L) 11.9(L) 12.1  Hematocrit 36.0 - 46.0 % 31.4(L) 37.0 37.7  Platelets 150 - 400 K/uL 281 320 PLATELET CLUMPS NOTED ON SMEAR, COUNT APPEARS ADEQUATE   BMP Latest Ref Rng & Units 07/03/2016 07/02/2016 07/01/2016  Glucose 65 - 99 mg/dL 149(H) 117(H) 114(H)  BUN 6 - 20 mg/dL 16 21(H) 17  Creatinine 0.44 - 1.00 mg/dL 0.76 1.08(H) 1.08(H)  BUN/Creat Ratio 12 - 28 - - -  Sodium 135 - 145 mmol/L 139 139 139  Potassium 3.5 - 5.1 mmol/L 3.4(L) 2.8(L) 3.0(L)  Chloride 101 - 111 mmol/L 107 104 105  CO2 22 - 32 mmol/L _0 Calcium 8.9 - 10.3 mg/dL 8.9 9.5 9.5   Magnesium 2.2  Sedimentation rate 122  Diagnosis septic arthritis right knee question partial treatment, hypokalemia  Recommend arthroscopic lavage right knee  This procedure has been fully reviewed with the patient and written informed consent has been obtained.     Arther Abbott, MD 07/03/2016, 8:05 AM

## 2016-07-03 NOTE — Op Note (Signed)
07/02/2016 - 07/03/2016  12:57 PM  PATIENT:  Lindsey Phillips  65 y.o. female  PRE-OPERATIVE DIAGNOSIS:  right knee effusion, septic joint  POST-OPERATIVE DIAGNOSIS:  right knee effusion, septic joint  PROCEDURE:  Procedure(s): RIGHT KNEE ARTHROSCOPY (Right)  SURGEON:  Surgeon(s) and Role:    * Mayrene Bastarache E Jaskaran Dauzat, MD - Primary  PHYSICIAN ASSISTANT:   ASSISTANTS: none   ANESTHESIA:   regional  EBL:  Total I/O In: 800 [I.V.:800] Out: 0   BLOOD ADMINISTERED:none  DRAINS: none   LOCAL MEDICATIONS USED:  MARCAINE     SPECIMEN:  No Specimen  DISPOSITION OF SPECIMEN:  N/A  COUNTS:  YES  TOURNIQUET:    DICTATION: .Dragon Dictation  PLAN OF CARE: Admit to inpatient   PATIENT DISPOSITION:  PACU - hemodynamically stable.   Delay start of Pharmacological VTE agent (>24hrs) due to surgical blood loss or risk of bleeding: yes  Details of procedure Lindsey Phillips was identified in the preop area the right knee was confirmed and marked for surgery thorough chart review was completed and consent was signed  Patient was taken to surgery had general anesthesia. She was in the supine position. The right leg was in the arthroscopic leg holder and the left leg was padded in a well leg holder.  Sterile prep and drape was performed. Timeout was completed.  Lateral portal was made and the scope was placed into the joint. The medial portal was established and the arthroscopic shaver was placed in the joint.  A thorough evaluation of the knee joint was performed. The medial meniscus was normal grade 1 arthritis in the medial compartment  Anterior cruciate ligament PCL intact with synovial inflammation. Lateral meniscus intact mild grade 1 arthritis lateral compartment.  Patellofemoral joint showed synovial tissue which was injected with hemorrhagic appearance. Some adhesions had formed there and they were removed with the shaver  The medial and lateral gutters were also inflamed with  injected synovium.  I did place the joint through the notch into the back of the knee and irrigated there.  Upon entry into the joint gross purulent material was noted.  9 L of fluid were run through the joint adhesions were debrided as stated.  Portals were closed with 3-0 nylon suture and core percent Marcaine with epinephrine 60 mL injected into the joint sterile dressing was applied  Postop plan patient will be reevaluated at the end of the day for possible discharge if not we'll discharge tomorrow  Culture specific antibiotics will be started once that is obtained  Weightbearing as tolerated with crutches and/or walker plus knee brace will be instituted as well.  [id number]  

## 2016-07-03 NOTE — Care Management Obs Status (Signed)
MEDICARE OBSERVATION STATUS NOTIFICATION   Patient Details  Name: Lindsey Phillips MRN: [id number] Date of Birth: 09/07/1950   Medicare Observation Status Notification Given:  Yes    Rafael Salway, Chrystine OilerSharley Diane, RN 07/03/2016, 9:38 AM

## 2016-07-03 NOTE — Anesthesia Preprocedure Evaluation (Signed)
Anesthesia Evaluation  Patient identified by MRN, date of birth, ID band Patient awake    Reviewed: Allergy & Precautions, NPO status , Patient's Chart, lab work & pertinent test results  Airway Mallampati: I  TM Distance: >3 FB     Dental  (+) Edentulous Upper, Edentulous Lower   Pulmonary former smoker,    breath sounds clear to auscultation       Cardiovascular negative cardio ROS   Rhythm:Regular Rate:Normal     Neuro/Psych    GI/Hepatic negative GI ROS,   Endo/Other    Renal/GU      Musculoskeletal  (+) Arthritis ,   Abdominal   Peds  Hematology   Anesthesia Other Findings   Reproductive/Obstetrics                             Anesthesia Physical Anesthesia Plan  ASA: II  Anesthesia Plan: General   Post-op Pain Management:    Induction: Intravenous  Airway Management Planned: LMA  Additional Equipment:   Intra-op Plan:   Post-operative Plan: Extubation in OR  Informed Consent: I have reviewed the patients History and Physical, chart, labs and discussed the procedure including the risks, benefits and alternatives for the proposed anesthesia with the patient or authorized representative who has indicated his/her understanding and acceptance.     Plan Discussed with:   Anesthesia Plan Comments:         Anesthesia Quick Evaluation

## 2016-07-03 NOTE — Transfer of Care (Signed)
Immediate Anesthesia Transfer of Care Note  Patient: Lindsey Phillips  Procedure(s) Performed: Procedure(s): RIGHT KNEE ARTHROSCOPY (Right)  Patient Location: PACU  Anesthesia Type:General  Level of Consciousness: awake and patient cooperative  Airway & Oxygen Therapy: Patient Spontanous Breathing and non-rebreather face mask  Post-op Assessment: Report given to RN and Post -op Vital signs reviewed and stable  Post vital signs: Reviewed and stable  Last Vitals:  Vitals:   07/03/16 1150 07/03/16 1155  BP: 128/76 120/74  Pulse:    Resp: (!) 21 18  Temp:      Last Pain:  Vitals:   07/03/16 1113  TempSrc: Oral  PainSc: 6       Patients Stated Pain Goal: 3 (07/03/16 1113)  Complications: No apparent anesthesia complications

## 2016-07-04 MED ORDER — SULFAMETHOXAZOLE-TRIMETHOPRIM 800-160 MG PO TABS: 1.0000 | ORAL_TABLET | Freq: Two times a day (BID) | ORAL | 1 refills | Status: DC

## 2016-07-04 MED ORDER — HYDROCODONE-ACETAMINOPHEN 7.5-325 MG PO TABS: 1.0000 | ORAL_TABLET | Freq: Four times a day (QID) | ORAL | 0 refills | Status: DC | PRN

## 2016-07-04 NOTE — Discharge Instructions (Signed)
Use walker for 10 days, place as much weight on your right leg as tolerated   Walk with brace if needed   Do not wear brace to bed   Bend the knee 25X, 3 x a day   Use ice 20 min 4 x a day   Ok to shower , remove  band aids before the shower then reapply

## 2016-07-04 NOTE — Care Management Important Message (Signed)
Important Message  Patient Details  Name: Clearence PedWanda G Defoor MRN: [id number] Date of Birth: 12/31/1950   Medicare Important Message Given:  Yes    Malcolm MetroChildress, Micheale Schlack Demske, RN 07/04/2016, 9:40 AM

## 2016-07-04 NOTE — Care Management Note (Signed)
Case Management Note  Patient Details  Name: Lindsey Phillips MRN: [id number] Date of Birth: 08/08/1950  Expected Discharge Date:  07/05/16               Expected Discharge Plan:  Assisted Living / Rest Home  In-House Referral:  NA  Discharge planning Services  CM Consult  Post Acute Care Choice:  Home Health Choice offered to:  Patient  DME Arranged:    DME Agency:     HH Arranged:  PT, RN HH Agency:  [facility]  Status of Service:  Completed, signed off   Additional Comments: Alroy BailiffLinda Lothian, [facility], aware of [facility] today. Pt aware HH has 48hrs to make first visit. Family at bedside.   Malcolm Metrohildress, Obe Ahlers Demske, RN 07/04/2016, 9:41 AM

## 2016-07-04 NOTE — Discharge Summary (Signed)
Physician Discharge Summary  Patient ID: Lindsey PedWanda G Duddy MRN: [id number] DOB/AGE: 65/01/1951 65 y.o.  Admit date: 07/02/2016 Discharge date: 07/04/2016  Admission Diagnoses: septic arthritis right knee   Discharge Diagnoses: same  Active Problems:   Pyogenic arthritis of right knee joint (HCC)   Hypokalemia   Septic arthritis [facility](HCC)   Discharged Condition: good  Hospital Course:   December 17 hospital day 1 admitted antibiotics in preparation for intraoperative culture  December 18 patient underwent uncomplicated arthroscopy and lavage right knee for septic arthritis with intraoperative culture sent   She was able to tolerate physical therapy with a walker weight bearing as tolerated in a leg brace, postop was given Ancef 2 g every 8 hours for 2 doses  December 19 she was 99.1 vital signs are stable cultures were still pending but initial report is staph aureus  She has improved in terms of her pain control in her knee is sore but feels much better   3d ago    Specimen Description  SYNOVIAL   Special Requests  BOTTLES DRAWN AEROBIC AND ANAEROBIC 8CC   Gram Stain GRAM POSITIVE COCCI Gram Stain Report Called to,Read Back By and Verified With: VOGLER,T. AT 1310 ON 07/02/16 BY EVA  IN BOTH AEROBIC AND ANAEROBIC BOTTLES  [facility]  [facility]       Culture  STAPHYLOCOCCUS AUREUS    Report Status PENDING   Resulting Agency [facility]    Specimen Collected: 07/01/16 19:09 Last Resulted: 07/03/16 11:22     Specimen Collected: 07/01/16 19:17 Last Resulted: 07/01/16 20:02                     Synovial cell count + diff, w/ crystals  Order: [id number]   Status:  Final result   Visible to patient:  No (Not Released) Next appt:  07/06/2016 at 04:25 PM in Family Medicine (Jannifer Rodneyhristy Hawks, FNP)    Ref Range & Units 3d ago   Color, Synovial YELLOW YELLOW    Appearance-Synovial CLEAR TURBID    Crystals, Fluid   NO CRYSTALS SEEN   WBC,  Synovial 0 - 200 /cu mm 31,050    Neutrophil, Synovial 0 - 25 % 95    Lymphocytes-Synovial Fld 0 - 20 % 3   Monocyte-Macrophage-Synovial Fluid 50 - 90 % 2    Eosinophils-Synovial 0 - 1 % 0   Other Cells-SYN  0   Resulting Agency  [facility]    Specimen Collected: 07/01/16 19:09 Last Resulted: 07/01/16 20:46                    Gram stain  Order: [id number]   Status:  Final result   Visible to patient:  No (Not Released) Next appt:  07/06/2016 at 04:25 PM in Family Medicine [facility](Christy Hawks, FNP)  Newer results are available. Click to view them now.    3d ago   Specimen Description  KNEE RIGHT   Special Requests Normal   Gram Stain CYTOSPIN SMEAR NO ORGANISMS SEEN WBC PRESENT, PREDOMINANTLY PMN   Report Status  07/01/2016 FINAL   Resulting Agency [facility]    Specimen Collected: 07/01/16 19:09 Last Resulted: 07/01/16 20:12              Discharge Exam: Blood pressure (!) 159/68, pulse 100, temperature 99.1 F (37.3 C), temperature source Oral, resp. rate 18, height 5\' 9"  (1.753 m), weight 197 lb (89.4 kg), SpO2 95 %. Knee mild effusion  Mild  tenderness no erythema or warmth  Disposition: 01-Home or Self Care  Discharge Instructions    Diet - low sodium heart healthy    Complete by:  As directed    Discharge instructions    Complete by:  As directed    Use walker for 10 days, place as much weight on your right leg as tolerated   Walk with brace if needed   Do not wear brace to bed   Bend the knee 25X, 3 x a day   Use ice 20 min 4 x a day   Ok to shower , remove  band aids before the shower then reapply   Increase activity slowly    Complete by:  As directed      Allergies as of 07/04/2016   No Known Allergies     Medication List    STOP taking these medications   cephALEXin 500 MG capsule Commonly known as:  KEFLEX   HYDROcodone-acetaminophen 5-325 MG tablet Commonly known as:  NORCO/VICODIN Replaced by:  HYDROcodone-acetaminophen 7.5-325 MG  tablet     TAKE these medications   atorvastatin 40 MG tablet Commonly known as:  LIPITOR Take 1 tablet (40 mg total) by mouth daily.   HYDROcodone-acetaminophen 7.5-325 MG tablet Commonly known as:  NORCO Take 1 tablet by mouth every 6 (six) hours as needed for moderate pain. Replaces:  HYDROcodone-acetaminophen 5-325 MG tablet   ibuprofen 600 MG tablet Commonly known as:  ADVIL,MOTRIN Take 1 tablet (600 mg total) by mouth every 6 (six) hours as needed.   sulfamethoxazole-trimethoprim 800-160 MG tablet Commonly known as:  BACTRIM DS,SEPTRA DS Take 1 tablet by mouth 2 (two) times daily.      Schedule appointment for January 3  Signed: Fuller CanadaStanley Rodricus Candelaria 07/04/2016, 8:30 AM

## 2016-07-05 ENCOUNTER — Encounter (HOSPITAL_COMMUNITY): Payer: Self-pay | Admitting: Orthopedic Surgery

## 2016-07-05 DIAGNOSIS — Z87891 Personal history of nicotine dependence: Secondary | ICD-10-CM | POA: Diagnosis not present

## 2016-07-05 DIAGNOSIS — M00861 Arthritis due to other bacteria, right knee: Secondary | ICD-10-CM | POA: Diagnosis not present

## 2016-07-05 DIAGNOSIS — Z4789 Encounter for other orthopedic aftercare: Secondary | ICD-10-CM | POA: Diagnosis not present

## 2016-07-05 DIAGNOSIS — B9689 Other specified bacterial agents as the cause of diseases classified elsewhere: Secondary | ICD-10-CM | POA: Diagnosis not present

## 2016-07-05 LAB — CULTURE, BODY FLUID W GRAM STAIN -BOTTLE

## 2016-07-05 LAB — CULTURE, BODY FLUID-BOTTLE

## 2016-07-06 ENCOUNTER — Ambulatory Visit: Payer: Medicare Other | Admitting: Family

## 2016-07-06 ENCOUNTER — Telehealth (HOSPITAL_BASED_OUTPATIENT_CLINIC_OR_DEPARTMENT_OTHER): Payer: Self-pay

## 2016-07-06 DIAGNOSIS — B9689 Other specified bacterial agents as the cause of diseases classified elsewhere: Secondary | ICD-10-CM | POA: Diagnosis not present

## 2016-07-06 DIAGNOSIS — M00861 Arthritis due to other bacteria, right knee: Secondary | ICD-10-CM | POA: Diagnosis not present

## 2016-07-06 DIAGNOSIS — Z4789 Encounter for other orthopedic aftercare: Secondary | ICD-10-CM | POA: Diagnosis not present

## 2016-07-06 DIAGNOSIS — Z87891 Personal history of nicotine dependence: Secondary | ICD-10-CM | POA: Diagnosis not present

## 2016-07-06 NOTE — Telephone Encounter (Signed)
Post ED Visit - Positive Culture Follow-up  Culture report reviewed by antimicrobial stewardship pharmacist:  []  Enzo BiNathan Batchelder, Pharm.D. []  Celedonio MiyamotoJeremy Frens, Pharm.D., BCPS [x]  Garvin FilaMike Maccia, Pharm.D. []  Georgina PillionElizabeth Martin, Pharm.D., BCPS []  TarentumMinh Pham, [address]Ph[address]D., BCPS, AAHIVP []  Estella HuskMichelle Turner, Pharm.D., BCPS, AAHIVP []  Tennis Mustassie Stewart, [address]Pharm[address]D. []  Sherle Poeob Vincent, [address]  Positive culture, Staph species  "[facility]"  Lindsey Phillips, Lindsey Phillips 07/06/2016, 12:29 PM

## 2016-07-08 LAB — AEROBIC/ANAEROBIC CULTURE (SURGICAL/DEEP WOUND): CULTURE: NO GROWTH

## 2016-07-08 LAB — AEROBIC/ANAEROBIC CULTURE W GRAM STAIN (SURGICAL/DEEP WOUND)

## 2016-07-12 DIAGNOSIS — B9689 Other specified bacterial agents as the cause of diseases classified elsewhere: Secondary | ICD-10-CM | POA: Diagnosis not present

## 2016-07-12 DIAGNOSIS — M00861 Arthritis due to other bacteria, right knee: Secondary | ICD-10-CM | POA: Diagnosis not present

## 2016-07-12 DIAGNOSIS — Z4789 Encounter for other orthopedic aftercare: Secondary | ICD-10-CM | POA: Diagnosis not present

## 2016-07-12 DIAGNOSIS — Z87891 Personal history of nicotine dependence: Secondary | ICD-10-CM | POA: Diagnosis not present

## 2016-07-13 DIAGNOSIS — Z87891 Personal history of nicotine dependence: Secondary | ICD-10-CM | POA: Diagnosis not present

## 2016-07-13 DIAGNOSIS — Z4789 Encounter for other orthopedic aftercare: Secondary | ICD-10-CM | POA: Diagnosis not present

## 2016-07-13 DIAGNOSIS — M00861 Arthritis due to other bacteria, right knee: Secondary | ICD-10-CM | POA: Diagnosis not present

## 2016-07-13 DIAGNOSIS — B9689 Other specified bacterial agents as the cause of diseases classified elsewhere: Secondary | ICD-10-CM | POA: Diagnosis not present

## 2016-07-19 ENCOUNTER — Encounter: Payer: Self-pay | Admitting: Orthopedic Surgery

## 2016-07-19 ENCOUNTER — Ambulatory Visit (INDEPENDENT_AMBULATORY_CARE_PROVIDER_SITE_OTHER): Payer: Self-pay | Admitting: Orthopedic Surgery

## 2016-07-19 DIAGNOSIS — Z9889 Other specified postprocedural states: Secondary | ICD-10-CM

## 2016-07-19 DIAGNOSIS — Z4889 Encounter for other specified surgical aftercare: Secondary | ICD-10-CM

## 2016-07-19 NOTE — Patient Instructions (Signed)
Return to work January 8 full duty

## 2016-07-19 NOTE — Progress Notes (Signed)
Patient ID: [name], female   DOB: 06/18/1951, 66 y.o.   MRN: [id number]  Follow up visit/  postop visit #1 postop day 16  Chief Complaint  Patient presents with  . Follow-up    SARK, DOS 07/03/16    Status post joint lavage right knee for infection, initial cultures came back staph follow-up cultures were negative. Patient on Bactrim prophylactically.  No complaints related to the right knee she's regained full range of motion full weightbearing and no pain is noted.  The joint is quiet. There is no warmth erythema or effusion.   Encounter Diagnoses  Name Primary?  Marland Kitchen. Aftercare following surgery Yes  . S/P right knee arthroscopy     Discharge follow-up as needed may return to work January 8  9:25 AM Lindsey CanadaStanley Pearle Wandler, MD 07/19/2016

## 2016-09-22 ENCOUNTER — Encounter: Payer: Self-pay | Admitting: Nurse Practitioner

## 2016-09-22 ENCOUNTER — Ambulatory Visit (INDEPENDENT_AMBULATORY_CARE_PROVIDER_SITE_OTHER): Payer: Medicare Other | Admitting: Nurse Practitioner

## 2016-09-22 ENCOUNTER — Ambulatory Visit (INDEPENDENT_AMBULATORY_CARE_PROVIDER_SITE_OTHER): Payer: Medicare Other

## 2016-09-22 VITALS — BP 137/82 | HR 91 | Temp 97.1°F | Ht 69.0 in | Wt 195.0 lb

## 2016-09-22 DIAGNOSIS — E559 Vitamin D deficiency, unspecified: Secondary | ICD-10-CM | POA: Diagnosis not present

## 2016-09-22 DIAGNOSIS — Z6828 Body mass index (BMI) 28.0-28.9, adult: Secondary | ICD-10-CM | POA: Diagnosis not present

## 2016-09-22 DIAGNOSIS — M25562 Pain in left knee: Secondary | ICD-10-CM

## 2016-09-22 DIAGNOSIS — Z1211 Encounter for screening for malignant neoplasm of colon: Secondary | ICD-10-CM | POA: Diagnosis not present

## 2016-09-22 DIAGNOSIS — E785 Hyperlipidemia, unspecified: Secondary | ICD-10-CM

## 2016-09-22 MED ORDER — ATORVASTATIN CALCIUM 40 MG PO TABS: 40.0000 mg | ORAL_TABLET | Freq: Every day | ORAL | 1 refills | Status: DC

## 2016-09-22 MED ORDER — MELOXICAM 15 MG PO TABS: 15.0000 mg | ORAL_TABLET | Freq: Every day | ORAL | 3 refills | Status: DC

## 2016-09-22 NOTE — Progress Notes (Signed)
Subjective:    Patient ID: Lindsey Phillips, female    DOB: [birth year], 66 y.o.   MRN: [id number]   Patient here today for follow up of chronic medical problems. No changes since last visit.  Outpatient Encounter Prescriptions as of 05/23/2016  Medication Sig  . atorvastatin (LIPITOR) 20 MG tablet Take 1 tablet (20 mg total) by mouth daily.   Hyperlipidemia  This is a chronic problem. The current episode started more than 1 year ago. Recent lipid tests were reviewed and are variable. There are no known factors aggravating her hyperlipidemia. She is currently on no antihyperlipidemic treatment. The current treatment provides moderate improvement of lipids. Compliance problems include adherence to diet and adherence to exercise.  Risk factors for coronary artery disease include dyslipidemia and post-menopausal.  Vitamin d def Patient is no longer taking vitamin d- needs levels checked  * C/O left knee pain that started several months ago- seems to be worse when she is up walking around. SOme swelling at times  Review of Systems  Constitutional: Negative.   HENT: Negative.   Respiratory: Negative.   Cardiovascular: Negative.   Genitourinary: Negative.   Neurological: Negative.   Psychiatric/Behavioral: Negative.   All other systems reviewed and are negative.      Objective:   Physical Exam  Constitutional: She is oriented to person, place, and time. She appears well-developed and well-nourished.  HENT:  Nose: Nose normal.  Mouth/Throat: Oropharynx is clear and moist.  Eyes: EOM are normal.  Neck: Trachea normal, normal range of motion and full passive range of motion without pain. Neck supple. No JVD present. Carotid bruit is not present. No thyromegaly present.  Cardiovascular: Normal rate, regular rhythm, normal heart sounds and intact distal pulses.  Exam reveals no gallop and no friction rub.   No murmur heard. Pulmonary/Chest: Effort normal and breath sounds normal.  Abdominal:  Soft. Bowel sounds are normal. She exhibits no distension and no mass. There is no tenderness.  Musculoskeletal: Normal range of motion.  Lymphadenopathy:    She has no cervical adenopathy.  Neurological: She is alert and oriented to person, place, and time. She has normal reflexes.  Skin: Skin is warm and dry.  Psychiatric: She has a normal mood and affect. Her behavior is normal. Judgment and thought content normal.    BP 137/82   Pulse 91   Temp 97.1 F (36.2 C) (Oral)   Ht '5\' 9"'$  (1.753 m)   Wt 195 lb (88.5 kg)   BMI 28.80 kg/m   Left knee x ray- osteoarthritis-Preliminary reading by Ronnald Collum, FNP  [facility]'     Assessment & Plan:  1. Hyperlipidemia with target LDL less than 100 Low fat diet - atorvastatin (LIPITOR) 40 MG tablet; Take 1 tablet (40 mg total) by mouth daily.  Dispense: 90 tablet; Refill: 1 - CMP14+EGFR - Lipid panel  2. Vitamin D deficiency - VITAMIN D 25 Hydroxy (Vit-D Deficiency, Fractures)  3. Acute pain of left knee Ice BID Rest will do ortho referral when ready - DG Knee 1-2 Views Left; Future - meloxicam (MOBIC) 15 MG tablet; Take 1 tablet (15 mg total) by mouth daily.  Dispense: 30 tablet; Refill: 3  4. Encounter for screening colonoscopy - Ambulatory referral to Gastroenterology  5. BMI 28.0-28.9,adult Discussed diet and exercise for person with BMI >25 Will recheck weight in 3-6 months     Labs pending Health maintenance reviewed Diet and exercise encouraged Continue all meds Follow up  In 6 months  Mary-Margaret Aliha Diedrich, FNP   

## 2016-09-22 NOTE — Patient Instructions (Signed)

## 2016-09-23 LAB — CMP14+EGFR
ALT: 10 IU/L (ref 0–32)
AST: 17 IU/L (ref 0–40)
Albumin/Globulin Ratio: 1.3 (ref 1.2–2.2)
Albumin: 4.1 g/dL (ref 3.6–4.8)
Alkaline Phosphatase: 131 IU/L — ABNORMAL HIGH (ref 39–117)
BILIRUBIN TOTAL: 0.3 mg/dL (ref 0.0–1.2)
BUN/Creatinine Ratio: 19 (ref 12–28)
BUN: 16 mg/dL (ref 8–27)
CHLORIDE: 104 mmol/L (ref 96–106)
CO2: 25 mmol/L (ref 18–29)
CREATININE: 0.85 mg/dL (ref 0.57–1.00)
Calcium: 9.5 mg/dL (ref 8.7–10.3)
GFR calc non Af Amer: 72 mL/min/{1.73_m2} (ref >59)
GFR, EST AFRICAN AMERICAN: 83 mL/min/{1.73_m2} (ref >59)
GLUCOSE: 98 mg/dL (ref 65–99)
Globulin, Total: 3.2 g/dL (ref 1.5–4.5)
Potassium: 4.8 mmol/L (ref 3.5–5.2)
Sodium: 143 mmol/L (ref 134–144)
TOTAL PROTEIN: 7.3 g/dL (ref 6.0–8.5)

## 2016-09-23 LAB — LIPID PANEL
CHOLESTEROL TOTAL: 156 mg/dL (ref 100–199)
Chol/HDL Ratio: 4.6 ratio units — ABNORMAL HIGH (ref 0.0–4.4)
HDL: 34 mg/dL — AB (ref >39)
LDL CALC: 106 mg/dL — AB (ref 0–99)
Triglycerides: 78 mg/dL (ref 0–149)
VLDL CHOLESTEROL CAL: 16 mg/dL (ref 5–40)

## 2016-09-23 LAB — VITAMIN D 25 HYDROXY (VIT D DEFICIENCY, FRACTURES): Vit D, 25-Hydroxy: 18.6 ng/mL — ABNORMAL LOW (ref 30.0–100.0)

## 2016-11-09 ENCOUNTER — Other Ambulatory Visit: Payer: Self-pay | Admitting: *Deleted

## 2016-11-09 DIAGNOSIS — M25562 Pain in left knee: Secondary | ICD-10-CM

## 2016-11-09 MED ORDER — MELOXICAM 15 MG PO TABS: 15.0000 mg | ORAL_TABLET | Freq: Every day | ORAL | 0 refills | Status: DC

## 2016-12-18 ENCOUNTER — Telehealth: Payer: Self-pay | Admitting: Nurse Practitioner

## 2016-12-18 NOTE — Telephone Encounter (Signed)
Scheduled

## 2016-12-27 ENCOUNTER — Ambulatory Visit (INDEPENDENT_AMBULATORY_CARE_PROVIDER_SITE_OTHER): Payer: Medicare Other | Admitting: *Deleted

## 2016-12-27 ENCOUNTER — Encounter: Payer: Self-pay | Admitting: Gastroenterology

## 2016-12-27 ENCOUNTER — Ambulatory Visit (INDEPENDENT_AMBULATORY_CARE_PROVIDER_SITE_OTHER): Payer: Medicare Other

## 2016-12-27 VITALS — BP 121/75 | HR 89 | Ht 68.0 in | Wt 202.0 lb

## 2016-12-27 DIAGNOSIS — Z23 Encounter for immunization: Secondary | ICD-10-CM | POA: Diagnosis not present

## 2016-12-27 DIAGNOSIS — Z1211 Encounter for screening for malignant neoplasm of colon: Secondary | ICD-10-CM

## 2016-12-27 DIAGNOSIS — Z780 Asymptomatic menopausal state: Secondary | ICD-10-CM

## 2016-12-27 DIAGNOSIS — Z0000 Encounter for general adult medical examination without abnormal findings: Secondary | ICD-10-CM | POA: Diagnosis not present

## 2016-12-27 NOTE — Patient Instructions (Addendum)
You have been scheduled for your colonoscopy on August 29th at 8 am  Consultation for Colonoscopy is Wednesday August 15th at 10 am.  You had your dexa scan (bone density test) done today.    You received your pneumovax vaccine today  Please review the information given to you about Advanced Directives.  If you decide to put these in place, please bring a copy to our office and we will file in your medical record  Consider joining the [employer] to participate in [place] group or using stationary bike or recumbent stepper working up to 3 times per week for 30 minutes  Work on decreasing processed meat intake such as bacon and sausage to once per week.  Try incorporating oatmeal into your breakfasts for a healthy carbohydrate source  Thank you for seeing [place] for your Annual Wellness Visit today!!    Preventive Care 66 Years and Older, Female Preventive care refers to lifestyle choices and visits with your health care provider that can promote health and wellness. What does preventive care include?  A yearly physical exam. This is also called an annual well check.  Dental exams once or twice a year.  Routine eye exams. Ask your health care provider how often you should have your eyes checked.  Personal lifestyle choices, including: ? Daily care of your teeth and gums. ? Regular physical activity. ? Eating a healthy diet. ? Avoiding tobacco and drug use. ? Limiting alcohol use. ? Practicing safe sex. ? Taking low-dose aspirin every day. ? Taking vitamin and mineral supplements as recommended by your health care provider. What happens during an annual well check? The services and screenings done by your health care provider during your annual well check will depend on your age, overall health, lifestyle risk factors, and family history of disease. Counseling Your health care provider may ask you questions about your:  Alcohol use.  Tobacco use.  Drug use.  Emotional  well-being.  Home and relationship well-being.  Sexual activity.  Eating habits.  History of falls.  Memory and ability to understand (cognition).  Work and work Statistician.  Reproductive health.  Screening You may have the following tests or measurements:  Height, weight, and BMI.  Blood pressure.  Lipid and cholesterol levels. These may be checked every 5 years, or more frequently if you are over 3 years old.  Skin check.  Lung cancer screening. You may have this screening every year starting at age 47 if you have a 30-pack-year history of smoking and currently smoke or have quit within the past 15 years.  Fecal occult blood test (FOBT) of the stool. You may have this test every year starting at age 57.  Flexible sigmoidoscopy or colonoscopy. You may have a sigmoidoscopy every 5 years or a colonoscopy every 10 years starting at age 43.  Hepatitis C blood test.  Hepatitis B blood test.  Sexually transmitted disease (STD) testing.  Diabetes screening. This is done by checking your blood sugar (glucose) after you have not eaten for a while (fasting). You may have this done every 1-3 years.  Bone density scan. This is done to screen for osteoporosis. You may have this done starting at age 63.  Mammogram. This may be done every 1-2 years. Talk to your health care provider about how often you should have regular mammograms.  Talk with your health care provider about your test results, treatment options, and if necessary, the need for more tests. Vaccines Your health care provider may  recommend certain vaccines, such as:  Influenza vaccine. This is recommended every year.  Tetanus, diphtheria, and acellular pertussis (Tdap, Td) vaccine. You may need a Td booster every 10 years.  Varicella vaccine. You may need this if you have not been vaccinated.  Zoster vaccine. You may need this after age 66.  Measles, mumps, and rubella (MMR) vaccine. You may need at least  one dose of MMR if you were born in 1957 or later. You may also need a second dose.  Pneumococcal 13-valent conjugate (PCV13) vaccine. One dose is recommended after age 30.  Pneumococcal polysaccharide (PPSV23) vaccine. One dose is recommended after age 66.  Meningococcal vaccine. You may need this if you have certain conditions.  Hepatitis A vaccine. You may need this if you have certain conditions or if you travel or work in places where you may be exposed to hepatitis A.  Hepatitis B vaccine. You may need this if you have certain conditions or if you travel or work in places where you may be exposed to hepatitis B.  Haemophilus influenzae type b (Hib) vaccine. You may need this if you have certain conditions.  Talk to your health care provider about which screenings and vaccines you need and how often you need them. This information is not intended to replace advice given to you by your health care provider. Make sure you discuss any questions you have with your health care provider. Document Released: 07/30/2015 Document Revised: 03/22/2016 Document Reviewed: 05/04/2015 Elsevier Interactive Patient Education  2017 [employer].

## 2016-12-27 NOTE — Progress Notes (Addendum)
Subjective:   Lindsey Phillips is a 66 y.o. female who presents for an Initial Medicare Annual Wellness Visit.  She worked at [employer] for 42 years and [employer] for 1 year, until she retired at age 9.  She enjoys visiting her family, reading, walking, and doing yard work.  She is active in her church.  Lindsey Phillips is separated from her husband, and lives alone.  She has one daughter who lives in [place], [place], but she has a sister and cousins who live near her.  She was admitted to the hospital in December of 2017 for staph infection of her right knee and had arthroscopic surgery for this problem.  She reports her knee is feeling much better now, and she feels her overall health is better than it was last year.     Review of Systems    Lindsey Phillips reports no complaints today, review of systems negative        Objective:    Today's Vitals   12/27/16 1007  BP: 121/75  Pulse: 89  Weight: 202 lb (91.6 kg)  Height: 5\' 8"  (1.727 m)   Body mass index is 30.71 kg/m.   Current Medications (verified) Outpatient Encounter Prescriptions as of 12/27/2016  Medication Sig  . atorvastatin (LIPITOR) 40 MG tablet Take 1 tablet (40 mg total) by mouth daily.  . Cholecalciferol (VITAMIN D3) 2000 units TABS Take 1 tablet by mouth daily.  Marland Kitchen HYDROcodone-acetaminophen (NORCO) 7.5-325 MG tablet Take 1 tablet by mouth every 6 (six) hours as needed for moderate pain.  . meloxicam (MOBIC) 15 MG tablet Take 1 tablet (15 mg total) by mouth daily.  Marland Kitchen ibuprofen (ADVIL,MOTRIN) 600 MG tablet Take 1 tablet (600 mg total) by mouth every 6 (six) hours as needed. (Patient not taking: Reported on 09/22/2016)   No facility-administered encounter medications on file as of 12/27/2016.     Allergies (verified) Patient has no known allergies.   History: Past Medical History:  Diagnosis Date  . Hyperlipidemia    Past Surgical History:  Procedure Laterality Date  . ABDOMINAL HYSTERECTOMY    . KNEE ARTHROSCOPY  Right 07/03/2016   Procedure: RIGHT KNEE ARTHROSCOPY;  Surgeon: Vickki Hearing, MD;  Location: [facility];  Service: Orthopedics;  Laterality: Right;   Family History  Problem Relation Age of Onset  . Hypertension Mother   . Diabetes Mother   . Heart disease Mother   . Kidney disease Mother   . Diabetes Father   . Diabetes Brother   . Hypertension Brother   . Diabetes Brother   . Hypertension Brother    Social History   Occupational History  . Not on file.   Social History Main Topics  . Smoking status: Former Smoker    Packs/day: 0.25    Years: 15.00    Types: Cigarettes    Quit date: 07/17/2013  . Smokeless tobacco: Never Used  . Alcohol use No  . Drug use: No  . Sexual activity: Yes    Tobacco Counseling Counseling given: No   Activities of Daily Living In your present state of health, do you have any difficulty performing the following activities: 12/27/2016 07/02/2016  Hearing? N -  Vision? N -  Difficulty concentrating or making decisions? N -  Walking or climbing stairs? Y -  Dressing or bathing? N -  Doing errands, shopping? N N  Some recent data might be hidden    Immunizations and Health Maintenance Immunization History  Administered  Date(s) Administered  . Influenza-Unspecified 04/16/2016  . Pneumococcal Conjugate-13 11/18/2015  . Pneumococcal Polysaccharide-23 12/27/2016   Health Maintenance Due  Topic Date Due  . TETANUS/TDAP  08/23/1969  . COLON CANCER SCREENING ANNUAL FOBT  08/23/2000  . PNA vac Low Risk Adult (2 of 2 - PPSV23) 11/17/2016    Patient Care Team: Bennie Pierini, FNP as PCP - General (Nurse Practitioner)       Assessment:   This is a routine wellness examination for Lindsey Phillips.  Hearing/Vision screen  No hearing deficits noted. Has not seen the eye doctor in a few years.  Has no history of cataracts or glaucoma.  States she sees well. Recommended her to schedule routine eye exam.     Dietary issues and exercise  activities discussed: Current Exercise Habits: Home exercise routine, Type of exercise: walking, Time (Minutes): 20, Frequency (Times/Week): 2, Weekly Exercise (Minutes/Week): 40, Intensity: Mild, Exercise limited by: orthopedic condition(s)  Goals    . Exercise 3x per week (30 min per time)          [employer] and include participating in [employer] group or stationary bike/recumbent stepper in current exercise regimen.      . Increase lean proteins          Decrease intake of processed meat like bacon and sausage - increase intake of lean protein such as [place], chicken, fish- broiled, grilled, baked.  Avoid frying foods.      Depression Screen PHQ 2/9 Scores 12/27/2016 09/22/2016 06/29/2016 05/23/2016 11/18/2015  PHQ - 2 Score 0 1 0 1 0    Fall Risk Fall Risk  12/27/2016 09/22/2016 06/29/2016 05/23/2016 11/18/2015  Falls in the past year? No No No No No    Cognitive Function: MMSE - Mini Mental State Exam 12/27/2016  Orientation to time 5  Orientation to Place 5  Registration 3  Attention/ Calculation 2  Recall 0  Language- name 2 objects 2  Language- repeat 1  Language- follow 3 step command 3  Language- read & follow direction 1  Write a sentence 1  Copy design 0  Total score 23        Screening Tests Health Maintenance  Topic Date Due  . TETANUS/TDAP  08/23/1969  . COLON CANCER SCREENING ANNUAL FOBT  08/23/2000  . PNA vac Low Risk Adult (2 of 2 - PPSV23) 11/17/2016  . INFLUENZA VACCINE  02/14/2017  . MAMMOGRAM  02/13/2018  . DEXA SCAN  Completed  . Hepatitis C Screening  Completed   Colonoscopy scheduled for 03/14/17 at [facility]. Pneumovax 23 given today Dexa Scan done today Tetanus vaccine declined today       Plan:    Have colonoscopy on August 29th at 8 am   Review the information given about Advanced Directives.  If you decide to put these in place, please bring a copy to our office and we will file in your medical record  Consider joining the [employer]  to participate in [employer] group or using stationary bike or recumbent stepper working up to 3 times per week for 30 minutes  Work on decreasing processed meat intake such as bacon and sausage to once per week.  Try incorporating oatmeal into your breakfasts for a healthy carbohydrate source    I have personally reviewed and noted the following in the patient's chart:   . Medical and social history . Use of alcohol, tobacco or illicit drugs  . Current medications and supplements . Functional ability and status .  Nutritional status . Physical activity . Advanced directives . List of other physicians . Hospitalizations, surgeries, and ER visits in previous 12 months . Vitals . Screenings to include cognitive, depression, and falls . Referrals and appointments  In addition, I have reviewed and discussed with patient certain preventive protocols, quality metrics, and best practice recommendations. A written personalized care plan for preventive services as well as general preventive health recommendations were provided to patient.     WYATT, AMY M, RN   12/27/2016   I have reviewed and agree with the above AWV documentation.    Jannifer Rodneyhristy Hawks, FNP

## 2017-02-28 ENCOUNTER — Encounter: Payer: Self-pay | Admitting: Gastroenterology

## 2017-02-28 ENCOUNTER — Ambulatory Visit (AMBULATORY_SURGERY_CENTER): Payer: Self-pay

## 2017-02-28 VITALS — Ht 68.0 in | Wt 204.4 lb

## 2017-02-28 DIAGNOSIS — Z1211 Encounter for screening for malignant neoplasm of colon: Secondary | ICD-10-CM

## 2017-02-28 MED ORDER — SUPREP BOWEL PREP KIT 17.5-3.13-1.6 GM/177ML PO SOLN: 1.0000 | Freq: Once | ORAL | 0 refills | Status: AC

## 2017-02-28 NOTE — Progress Notes (Signed)
No allergies to eggs or soy No past problems with anesthesia No home oxygen No diet meds  Declined emmi 

## 2017-03-01 ENCOUNTER — Other Ambulatory Visit: Payer: Self-pay | Admitting: Nurse Practitioner

## 2017-03-01 DIAGNOSIS — M25562 Pain in left knee: Secondary | ICD-10-CM

## 2017-03-01 NOTE — Telephone Encounter (Signed)
Last seen 09/22/16  MMM 

## 2017-03-06 ENCOUNTER — Telehealth: Payer: Self-pay | Admitting: Gastroenterology

## 2017-03-06 NOTE — Telephone Encounter (Signed)
Spoke with pt. She states the Suprep bowel prep would cost $100.00 even with insurance and she could not afford $50.00 with the coupon either. Pt will come to our office, to pick up a Suprep sample in the am. Suprep Sample-  Lot# [id number],JTT 06/20 left at the front desk on 4th floor. She will call back if she has further questions.

## 2017-03-14 ENCOUNTER — Ambulatory Visit (AMBULATORY_SURGERY_CENTER): Payer: Medicare Other | Admitting: Gastroenterology

## 2017-03-14 ENCOUNTER — Encounter: Payer: Self-pay | Admitting: Gastroenterology

## 2017-03-14 VITALS — BP 132/62 | HR 76 | Temp 96.9°F | Resp 13 | Ht 68.0 in | Wt 204.0 lb

## 2017-03-14 DIAGNOSIS — Z1211 Encounter for screening for malignant neoplasm of colon: Secondary | ICD-10-CM | POA: Diagnosis present

## 2017-03-14 DIAGNOSIS — Z1212 Encounter for screening for malignant neoplasm of rectum: Secondary | ICD-10-CM | POA: Diagnosis not present

## 2017-03-14 DIAGNOSIS — K5730 Diverticulosis of large intestine without perforation or abscess without bleeding: Secondary | ICD-10-CM

## 2017-03-14 MED ORDER — SODIUM CHLORIDE 0.9 % IV SOLN: 500.0000 mL | INTRAVENOUS | Status: AC

## 2017-03-14 NOTE — Progress Notes (Signed)
Report to PACU, RN, vss, BBS= Clear.  

## 2017-03-14 NOTE — Progress Notes (Signed)
Pt's states no medical or surgical changes since previsit or office visit. 

## 2017-03-14 NOTE — Op Note (Signed)
[facility] Patient Name: Lindsey Phillips Procedure Date: 03/14/2017 8:51 AM MRN: [id number] Endoscopist: Rachael Fee , MD Age: 66 Referring MD:  Date of Birth: [birth year] Gender: Female Account #: [id number] Procedure:                Colonoscopy Indications:              Screening for colorectal malignant neoplasm Medicines:                Monitored Anesthesia Care Procedure:                Pre-Anesthesia Assessment:                           - Prior to the procedure, a History and Physical                            was performed, and patient medications and                            allergies were reviewed. The patient's tolerance of                            previous anesthesia was also reviewed. The risks                            and benefits of the procedure and the sedation                            options and risks were discussed with the patient.                            All questions were answered, and informed consent                            was obtained. Prior Anticoagulants: The patient has                            taken no previous anticoagulant or antiplatelet                            agents. ASA Grade Assessment: II - A patient with                            mild systemic disease. After reviewing the risks                            and benefits, the patient was deemed in                            satisfactory condition to undergo the procedure.                           After obtaining informed consent, the colonoscope  was passed under direct vision. Throughout the                            procedure, the patient's blood pressure, pulse, and                            oxygen saturations were monitored continuously. The                            Colonoscope was introduced through the anus and                            advanced to the the cecum, identified by                            appendiceal orifice and  ileocecal valve. The                            colonoscopy was performed without difficulty. The                            patient tolerated the procedure well. The quality                            of the bowel preparation was good. The ileocecal                            valve, appendiceal orifice, and rectum were                            photographed. Scope In: 8:57:33 AM Scope Out: 9:11:14 AM Scope Withdrawal Time: 0 hours 9 minutes 17 seconds  Total Procedure Duration: 0 hours 13 minutes 41 seconds  Findings:                 Multiple small and large-mouthed diverticula were                            found in the left colon.                           The exam was otherwise without abnormality on                            direct and retroflexion views. Complications:            No immediate complications. Estimated blood loss:                            None. Estimated Blood Loss:     Estimated blood loss: none. Impression:               - Diverticulosis in the left colon.                           - The examination was otherwise normal on direct  and retroflexion views.                           - No specimens collected. Recommendation:           - Patient has a contact number available for                            emergencies. The signs and symptoms of potential                            delayed complications were discussed with the                            patient. Return to normal activities tomorrow.                            Written discharge instructions were provided to the                            patient.                           - Resume previous diet.                           - Continue present medications.                           - Repeat colonoscopy in 10 years for screening                            purposes. Rachael Fee, MD 03/14/2017 9:12:59 AM This report has been signed electronically.

## 2017-03-14 NOTE — Patient Instructions (Signed)
Discharge instructions given. Handout on diverticulosis. Resume previous medications. YOU HAD AN ENDOSCOPIC PROCEDURE TODAY AT THE [facility]:   Refer to the procedure report that was given to you for any specific questions about what was found during the examination.  If the procedure report does not answer your questions, please call your gastroenterologist to clarify.  If you requested that your care partner not be given the details of your procedure findings, then the procedure report has been included in a sealed envelope for you to review at your convenience later.  YOU SHOULD EXPECT: Some feelings of bloating in the abdomen. Passage of more gas than usual.  Walking can help get rid of the air that was put into your GI tract during the procedure and reduce the bloating. If you had a lower endoscopy (such as a colonoscopy or flexible sigmoidoscopy) you may notice spotting of blood in your stool or on the toilet paper. If you underwent a bowel prep for your procedure, you may not have a normal bowel movement for a few days.  Please Note:  You might notice some irritation and congestion in your nose or some drainage.  This is from the oxygen used during your procedure.  There is no need for concern and it should clear up in a day or so.  SYMPTOMS TO REPORT IMMEDIATELY:   Following lower endoscopy (colonoscopy or flexible sigmoidoscopy):  Excessive amounts of blood in the stool  Significant tenderness or worsening of abdominal pains  Swelling of the abdomen that is new, acute  Fever of 100F or higher   For urgent or emergent issues, a gastroenterologist can be reached at any hour by calling [phone].   DIET:  We do recommend a small meal at first, but then you may proceed to your regular diet.  Drink plenty of fluids but you should avoid alcoholic beverages for 24 hours.  ACTIVITY:  You should plan to take it easy for the rest of today and you should NOT DRIVE or use  heavy machinery until tomorrow (because of the sedation medicines used during the test).    FOLLOW UP: Our staff will call the number listed on your records the next business day following your procedure to check on you and address any questions or concerns that you may have regarding the information given to you following your procedure. If we do not reach you, we will leave a message.  However, if you are feeling well and you are not experiencing any problems, there is no need to return our call.  We will assume that you have returned to your regular daily activities without incident.  If any biopsies were taken you will be contacted by phone or by letter within the next 1-3 weeks.  Please call us at [phone] if you have not heard about the biopsies in 3 weeks.    SIGNATURES/CONFIDENTIALITY: You and/or your care partner have signed paperwork which will be entered into your electronic medical record.  These signatures attest to the fact that that the information above on your After Visit Summary has been reviewed and is understood.  Full responsibility of the confidentiality of this discharge information lies with you and/or your care-partner. 

## 2017-03-15 ENCOUNTER — Telehealth: Payer: Self-pay

## 2017-03-15 NOTE — Telephone Encounter (Signed)
  Follow up Call-  Call back number 03/14/2017  Post procedure Call Back phone  # [phone]  Permission to leave phone message Yes  Some recent data might be hidden     Patient questions:  Do you have a fever, pain , or abdominal swelling? No. Pain Score  0 *  Have you tolerated food without any problems? Yes.    Have you been able to return to your normal activities? Yes.    Do you have any questions about your discharge instructions: Diet   No. Medications  No. Follow up visit  No.  Do you have questions or concerns about your Care? No.  Actions: * If pain score is 4 or above: No action needed, pain <4.

## 2017-03-26 ENCOUNTER — Encounter: Payer: Self-pay | Admitting: Nurse Practitioner

## 2017-03-26 ENCOUNTER — Ambulatory Visit (INDEPENDENT_AMBULATORY_CARE_PROVIDER_SITE_OTHER): Payer: Medicare Other | Admitting: Nurse Practitioner

## 2017-03-26 VITALS — BP 127/81 | HR 78 | Temp 97.8°F | Ht 68.0 in | Wt 206.0 lb

## 2017-03-26 DIAGNOSIS — E559 Vitamin D deficiency, unspecified: Secondary | ICD-10-CM

## 2017-03-26 DIAGNOSIS — Z6828 Body mass index (BMI) 28.0-28.9, adult: Secondary | ICD-10-CM | POA: Diagnosis not present

## 2017-03-26 DIAGNOSIS — E785 Hyperlipidemia, unspecified: Secondary | ICD-10-CM | POA: Diagnosis not present

## 2017-03-26 MED ORDER — ATORVASTATIN CALCIUM 40 MG PO TABS: 40.0000 mg | ORAL_TABLET | Freq: Every day | ORAL | 1 refills | Status: DC

## 2017-03-26 NOTE — Patient Instructions (Signed)
Fat and Cholesterol Restricted Diet Getting too much fat and cholesterol in your diet may cause health problems. Following this diet helps keep your fat and cholesterol at normal levels. This can keep you from getting sick. What types of fat should I choose?  Choose monosaturated and polyunsaturated fats. These are found in foods such as olive oil, canola oil, flaxseeds, walnuts, almonds, and seeds.  Eat more omega-3 fats. Good choices include salmon, mackerel, sardines, tuna, flaxseed oil, and ground flaxseeds.  Limit saturated fats. These are in animal products such as meats, butter, and cream. They can also be in plant products such as palm oil, palm kernel oil, and coconut oil.  Avoid foods with partially hydrogenated oils in them. These contain trans fats. Examples of foods that have trans fats are stick margarine, some tub margarines, cookies, crackers, and other baked goods. What general guidelines do I need to follow?  Check food labels. Look for the words "trans fat" and "saturated fat."  When preparing a meal: ? Fill half of your plate with vegetables and green salads. ? Fill one fourth of your plate with whole grains. Look for the word "whole" as the first word in the ingredient list. ? Fill one fourth of your plate with lean protein foods.  Eat more foods that have fiber, like apples, carrots, beans, peas, and barley.  Eat more home-cooked foods. Eat less at restaurants and buffets.  Limit or avoid alcohol.  Limit foods high in starch and sugar.  Limit fried foods.  Cook foods without frying them. Baking, boiling, grilling, and broiling are all great options.  Lose weight if you are overweight. Losing even a small amount of weight can help your overall health. It can also help prevent diseases such as diabetes and heart disease. What foods can I eat? Grains Whole grains, such as whole wheat or whole grain breads, crackers, cereals, and pasta. Unsweetened oatmeal,  bulgur, barley, quinoa, or brown rice. Corn or whole wheat flour tortillas. Vegetables Fresh or frozen vegetables (raw, steamed, roasted, or grilled). Green salads. Fruits All fresh, canned (in natural juice), or frozen fruits. Meat and Other Protein Products Ground beef (85% or leaner), grass-fed beef, or beef trimmed of fat. Skinless chicken or turkey. Ground chicken or turkey. Pork trimmed of fat. All fish and seafood. Eggs. Dried beans, peas, or lentils. Unsalted nuts or seeds. Unsalted canned or dry beans. Dairy Low-fat dairy products, such as skim or 1% milk, 2% or reduced-fat cheeses, low-fat ricotta or cottage cheese, or plain low-fat yogurt. Fats and Oils Tub margarines without trans fats. Light or reduced-fat mayonnaise and salad dressings. Avocado. Olive, canola, sesame, or safflower oils. Natural peanut or almond butter (choose ones without added sugar and oil). The items listed above may not be a complete list of recommended foods or beverages. Contact your dietitian for more options. What foods are not recommended? Grains White bread. White pasta. White rice. Cornbread. Bagels, pastries, and croissants. Crackers that contain trans fat. Vegetables White potatoes. Corn. Creamed or fried vegetables. Vegetables in a cheese sauce. Fruits Dried fruits. Canned fruit in light or heavy syrup. Fruit juice. Meat and Other Protein Products Fatty cuts of meat. Ribs, chicken wings, bacon, sausage, bologna, salami, chitterlings, fatback, hot dogs, bratwurst, and packaged luncheon meats. Liver and organ meats. Dairy Whole or 2% milk, cream, half-and-half, and cream cheese. Whole milk cheeses. Whole-fat or sweetened yogurt. Full-fat cheeses. Nondairy creamers and whipped toppings. Processed cheese, cheese spreads, or cheese curds. Sweets and Desserts Corn   syrup, sugars, honey, and molasses. Candy. Jam and jelly. Syrup. Sweetened cereals. Cookies, pies, cakes, donuts, muffins, and ice  cream. Fats and Oils Butter, stick margarine, lard, shortening, ghee, or bacon fat. Coconut, palm kernel, or palm oils. Beverages Alcohol. Sweetened drinks (such as sodas, lemonade, and fruit drinks or punches). The items listed above may not be a complete list of foods and beverages to avoid. Contact your dietitian for more information. This information is not intended to replace advice given to you by your health care provider. Make sure you discuss any questions you have with your health care provider. Document Released: 01/02/2012 Document Revised: 03/09/2016 Document Reviewed: 10/02/2013 Elsevier Interactive Patient Education  2018 [employer]  

## 2017-03-26 NOTE — Progress Notes (Signed)
   Subjective:    Patient ID: Lindsey Phillips, female    DOB: [birth year], 66 y.o.   MRN: [id number]  HPI   ZANDRIA WOLDT is here today for follow up of chronic medical problem.  Outpatient Encounter Prescriptions as of 03/26/2017  Medication Sig  . atorvastatin (LIPITOR) 40 MG tablet Take 1 tablet (40 mg total) by mouth daily.  . Cholecalciferol (VITAMIN D3) 2000 units TABS Take 1 tablet by mouth daily.  Marland Kitchen ibuprofen (ADVIL,MOTRIN) 600 MG tablet Take 1 tablet (600 mg total) by mouth every 6 (six) hours as needed. (Patient not taking: Reported on 03/14/2017)  . meloxicam (MOBIC) 15 MG tablet TAKE 1 TABLET (15 MG TOTAL) BY MOUTH DAILY. (Patient not taking: Reported on 03/14/2017)    1. Vitamin D deficiency  Takes vitamin d daily when remembers to take!  2. Hyperlipidemia with target LDL less than 100  lipitor daily- tries to watch fat in diet  3. BMI 28.0-28.9,adult  No recent weight changes    New complaints: None today  Social history: Lives by herself- has retired from work   Review of Systems  Constitutional: Negative for activity change and appetite change.  HENT: Negative.   Eyes: Negative for pain.  Respiratory: Negative for shortness of breath.   Cardiovascular: Negative for chest pain, palpitations and leg swelling.  Gastrointestinal: Negative for abdominal pain.  Endocrine: Negative for polydipsia.  Genitourinary: Negative.   Skin: Negative for rash.  Neurological: Negative for dizziness, weakness and headaches.  Hematological: Does not bruise/bleed easily.  Psychiatric/Behavioral: Negative.   All other systems reviewed and are negative.      Objective:   Physical Exam  Constitutional: She is oriented to person, place, and time. She appears well-developed and well-nourished.  HENT:  Nose: Nose normal.  Mouth/Throat: Oropharynx is clear and moist.  Eyes: EOM are normal.  Neck: Trachea normal, normal range of motion and full passive range of motion without pain. Neck  supple. No JVD present. Carotid bruit is not present. No thyromegaly present.  Cardiovascular: Normal rate, regular rhythm, normal heart sounds and intact distal pulses.  Exam reveals no gallop and no friction rub.   No murmur heard. Pulmonary/Chest: Effort normal and breath sounds normal.  Abdominal: Soft. Bowel sounds are normal. She exhibits no distension and no mass. There is no tenderness.  Musculoskeletal: Normal range of motion.  Lymphadenopathy:    She has no cervical adenopathy.  Neurological: She is alert and oriented to person, place, and time. She has normal reflexes.  Skin: Skin is warm and dry.  Psychiatric: She has a normal mood and affect. Her behavior is normal. Judgment and thought content normal.   BP 127/81   Pulse 78   Temp 97.8 F (36.6 C) (Oral)   Ht '5\' 8"'$  (1.727 m)   Wt 206 lb (93.4 kg)   BMI 31.32 kg/m       Assessment & Plan:  1. Vitamin D deficiency Weight bearing exercises encouraged  2. Hyperlipidemia with target LDL less than 100 Low fat diet - atorvastatin (LIPITOR) 40 MG tablet; Take 1 tablet (40 mg total) by mouth daily.  Dispense: 90 tablet; Refill: 1 - CMP14+EGFR - Lipid panel  3. BMI 28.0-28.9,adult Discussed diet and exercise for person with BMI >25 Will recheck weight in 3-6 months    Labs pending Health maintenance reviewed Diet and exercise encouraged Continue all meds Follow up  In 6 months   [place], FNP

## 2017-03-27 LAB — CMP14+EGFR
ALK PHOS: 109 IU/L (ref 39–117)
ALT: 9 IU/L (ref 0–32)
AST: 29 IU/L (ref 0–40)
Albumin/Globulin Ratio: 1.3 (ref 1.2–2.2)
Albumin: 4 g/dL (ref 3.6–4.8)
BUN / CREAT RATIO: 22 (ref 12–28)
BUN: 21 mg/dL (ref 8–27)
Bilirubin Total: <0.2 mg/dL (ref 0.0–1.2)
CALCIUM: 9.2 mg/dL (ref 8.7–10.3)
CHLORIDE: 105 mmol/L (ref 96–106)
CO2: 18 mmol/L — AB (ref 20–29)
CREATININE: 0.96 mg/dL (ref 0.57–1.00)
GFR calc Af Amer: 71 mL/min/{1.73_m2} (ref >59)
GFR, EST NON AFRICAN AMERICAN: 62 mL/min/{1.73_m2} (ref >59)
GLOBULIN, TOTAL: 3.1 g/dL (ref 1.5–4.5)
GLUCOSE: 98 mg/dL (ref 65–99)
Potassium: 5.2 mmol/L (ref 3.5–5.2)
Sodium: 142 mmol/L (ref 134–144)
Total Protein: 7.1 g/dL (ref 6.0–8.5)

## 2017-03-27 LAB — LIPID PANEL
CHOLESTEROL TOTAL: 147 mg/dL (ref 100–199)
Chol/HDL Ratio: 4.3 ratio (ref 0.0–4.4)
HDL: 34 mg/dL — ABNORMAL LOW (ref >39)
LDL Calculated: 94 mg/dL (ref 0–99)
Triglycerides: 94 mg/dL (ref 0–149)
VLDL Cholesterol Cal: 19 mg/dL (ref 5–40)

## 2017-09-24 ENCOUNTER — Ambulatory Visit (INDEPENDENT_AMBULATORY_CARE_PROVIDER_SITE_OTHER): Payer: Medicare Other | Admitting: Nurse Practitioner

## 2017-09-24 ENCOUNTER — Encounter: Payer: Self-pay | Admitting: Nurse Practitioner

## 2017-09-24 VITALS — BP 114/73 | HR 76 | Temp 96.9°F | Ht 68.0 in | Wt 209.0 lb

## 2017-09-24 DIAGNOSIS — Z6828 Body mass index (BMI) 28.0-28.9, adult: Secondary | ICD-10-CM

## 2017-09-24 DIAGNOSIS — E559 Vitamin D deficiency, unspecified: Secondary | ICD-10-CM | POA: Diagnosis not present

## 2017-09-24 DIAGNOSIS — E785 Hyperlipidemia, unspecified: Secondary | ICD-10-CM

## 2017-09-24 MED ORDER — ATORVASTATIN CALCIUM 40 MG PO TABS: 40.0000 mg | ORAL_TABLET | Freq: Every day | ORAL | 1 refills | Status: DC

## 2017-09-24 NOTE — Progress Notes (Signed)
   Subjective:    Patient ID: Lindsey Phillips, female    DOB: 12/18/1950, 68 y.o.   MRN: [id number]  HPI  Lindsey Phillips is here today for follow up of chronic medical problem.  Outpatient Encounter Medications as of 09/24/2017  Medication Sig  . atorvastatin (LIPITOR) 40 MG tablet Take 1 tablet (40 mg total) by mouth daily.  . Cholecalciferol (VITAMIN D3) 2000 units TABS Take 1 tablet by mouth daily.  Marland Kitchen ibuprofen (ADVIL,MOTRIN) 600 MG tablet Take 1 tablet (600 mg total) by mouth every 6 (six) hours as needed.  . meloxicam (MOBIC) 15 MG tablet TAKE 1 TABLET (15 MG TOTAL) BY MOUTH DAILY.     1. BMI 28.0-28.9,adult  No recent weight changes  2. Hyperlipidemia with target LDL less than 100  Denies to watching diet- no exercise  3. 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 Has family that live just above her that check on her frequently.    Review of Systems  Constitutional: Negative for activity change and appetite change.  HENT: Negative.   Eyes: Negative for pain.  Respiratory: Negative for shortness of breath.   Cardiovascular: Negative for chest pain, palpitations and leg swelling.  Gastrointestinal: Negative for abdominal pain.  Endocrine: Negative for polydipsia.  Genitourinary: Negative.   Skin: Negative for rash.  Neurological: Negative for dizziness, weakness and headaches.  Hematological: Does not bruise/bleed easily.  Psychiatric/Behavioral: Negative.   All other systems reviewed and are negative.      Objective:   Physical Exam  Constitutional: She is oriented to person, place, and time. She appears well-developed and well-nourished.  HENT:  Nose: Nose normal.  Mouth/Throat: Oropharynx is clear and moist.  Eyes: EOM are normal.  Neck: Trachea normal, normal range of motion and full passive range of motion without pain. Neck supple. No JVD present. Carotid bruit is not present.  No thyromegaly present.  Cardiovascular: Normal rate, regular rhythm, normal heart sounds and intact distal pulses. Exam reveals no gallop and no friction rub.  No murmur heard. Pulmonary/Chest: Effort normal and breath sounds normal.  Abdominal: Soft. Bowel sounds are normal. She exhibits no distension and no mass. There is no tenderness.  Musculoskeletal: Normal range of motion.  Lymphadenopathy:    She has no cervical adenopathy.  Neurological: She is alert and oriented to person, place, and time. She has normal reflexes.  Skin: Skin is warm and dry.  Psychiatric: She has a normal mood and affect. Her behavior is normal. Judgment and thought content normal.   BP 114/73   Pulse 76   Temp (!) 96.9 F (36.1 C) (Oral)   Ht '5\' 8"'$  (1.727 m)   Wt 209 lb (94.8 kg)   BMI 31.78 kg/m         Assessment & Plan:  1. BMI 28.0-28.9,adult Discussed diet and exercise for person with BMI >25 Will recheck weight in 3-6 months  2. Hyperlipidemia with target LDL less than 100 Low fat diet encouraged - atorvastatin (LIPITOR) 40 MG tablet; Take 1 tablet (40 mg total) by mouth daily.  Dispense: 90 tablet; Refill: 1 - CMP14+EGFR - Lipid panel  3. Vitamin D deficiency continue daily vitamin d supplement Weight bearing exercises encouraged    Labs pending Health maintenance reviewed Diet and exercise encouraged Continue all meds Follow up  In 6 months   [place], FNP

## 2017-09-24 NOTE — Patient Instructions (Signed)
Fat and Cholesterol Restricted Diet High levels of fat and cholesterol in your blood may lead to various health problems, such as diseases of the heart, blood vessels, gallbladder, liver, and pancreas. Fats are concentrated sources of energy that come in various forms. Certain types of fat, including saturated fat, may be harmful in excess. Cholesterol is a substance needed by your body in small amounts. Your body makes all the cholesterol it needs. Excess cholesterol comes from the food you eat. When you have high levels of cholesterol and saturated fat in your blood, health problems can develop because the excess fat and cholesterol will gather along the walls of your blood vessels, causing them to narrow. Choosing the right foods will help you control your intake of fat and cholesterol. This will help keep the levels of these substances in your blood within normal limits and reduce your risk of disease. What is my plan? Your health care provider recommends that you:  Limit your fat intake to ______% or less of your total calories per day.  Limit the amount of cholesterol in your diet to less than _________mg per day.  Eat 20-30 grams of fiber each day.  What types of fat should I choose?  Choose healthy fats more often. Choose monounsaturated and polyunsaturated fats, such as olive and canola oil, flaxseeds, walnuts, almonds, and seeds.  Eat more omega-3 fats. Good choices include salmon, mackerel, sardines, tuna, flaxseed oil, and ground flaxseeds. Aim to eat fish at least two times a week.  Limit saturated fats. Saturated fats are primarily found in animal products, such as meats, butter, and cream. Plant sources of saturated fats include palm oil, palm kernel oil, and coconut oil.  Avoid foods with partially hydrogenated oils in them. These contain trans fats. Examples of foods that contain trans fats are stick margarine, some tub margarines, cookies, crackers, and other baked goods. What  general guidelines do I need to follow? These guidelines for healthy eating will help you control your intake of fat and cholesterol:  Check food labels carefully to identify foods with trans fats or high amounts of saturated fat.  Fill one half of your plate with vegetables and green salads.  Fill one fourth of your plate with whole grains. Look for the word "whole" as the first word in the ingredient list.  Fill one fourth of your plate with lean protein foods.  Limit fruit to two servings a day. Choose fruit instead of juice.  Eat more foods that contain fiber, such as apples, broccoli, carrots, beans, peas, and barley.  Eat more home-cooked food and less restaurant, buffet, and fast food.  Limit or avoid alcohol.  Limit foods high in starch and sugar.  Limit fried foods.  Cook foods using methods other than frying. Baking, boiling, grilling, and broiling are all great options.  Lose weight if you are overweight. Losing just 5-10% of your initial body weight can help your overall health and prevent diseases such as diabetes and heart disease.  What foods can I eat? Grains  Whole grains, such as whole wheat or whole grain breads, crackers, cereals, and pasta. Unsweetened oatmeal, bulgur, barley, quinoa, or brown rice. Corn or whole wheat flour tortillas. Vegetables  Fresh or frozen vegetables (raw, steamed, roasted, or grilled). Green salads. Fruits  All fresh, canned (in natural juice), or frozen fruits. Meats and other protein foods  Ground beef (85% or leaner), grass-fed beef, or beef trimmed of fat. Skinless chicken or turkey. Ground chicken or turkey.   Pork trimmed of fat. All fish and seafood. Eggs. Dried beans, peas, or lentils. Unsalted nuts or seeds. Unsalted canned or dry beans. Dairy  Low-fat dairy products, such as skim or 1% milk, 2% or reduced-fat cheeses, low-fat ricotta or cottage cheese, or plain low-fat yo Fats and oils  Tub margarines without trans  fats. Light or reduced-fat mayonnaise and salad dressings. Avocado. Olive, canola, sesame, or safflower oils. Natural peanut or almond butter (choose ones without added sugar and oil). The items listed above may not be a complete list of recommended foods or beverages. Contact your dietitian for more options. Foods to avoid Grains  White bread. White pasta. White rice. Cornbread. Bagels, pastries, and croissants. Crackers that contain trans fat. Vegetables  White potatoes. Corn. Creamed or fried vegetables. Vegetables in a cheese sauce. Fruits  Dried fruits. Canned fruit in light or heavy syrup. Fruit juice. Meats and other protein foods  Fatty cuts of meat. Ribs, chicken wings, bacon, sausage, bologna, salami, chitterlings, fatback, hot dogs, bratwurst, and packaged luncheon meats. Liver and organ meats. Dairy  Whole or 2% milk, cream, half-and-half, and cream cheese. Whole milk cheeses. Whole-fat or sweetened yogurt. Full-fat cheeses. Nondairy creamers and whipped toppings. Processed cheese, cheese spreads, or cheese curds. Beverages  Alcohol. Sweetened drinks (such as sodas, lemonade, and fruit drinks or punches). Fats and oils  Butter, stick margarine, lard, shortening, ghee, or bacon fat. Coconut, palm kernel, or palm oils. Sweets and desserts  Corn syrup, sugars, honey, and molasses. Candy. Jam and jelly. Syrup. Sweetened cereals. Cookies, pies, cakes, donuts, muffins, and ice cream. The items listed above may not be a complete list of foods and beverages to avoid. Contact your dietitian for more information. This information is not intended to replace advice given to you by your health care provider. Make sure you discuss any questions you have with your health care provider. Document Released: 07/03/2005 Document Revised: 07/24/2014 Document Reviewed: 10/01/2013 Elsevier Interactive Patient Education  2018 [employer] Inc[employer]  

## 2017-09-25 LAB — CMP14+EGFR
A/G RATIO: 1.2 (ref 1.2–2.2)
ALK PHOS: 123 IU/L — AB (ref 39–117)
ALT: 10 IU/L (ref 0–32)
AST: 13 IU/L (ref 0–40)
Albumin: 3.9 g/dL (ref 3.6–4.8)
BILIRUBIN TOTAL: 0.3 mg/dL (ref 0.0–1.2)
BUN/Creatinine Ratio: 20 (ref 12–28)
BUN: 18 mg/dL (ref 8–27)
CHLORIDE: 108 mmol/L — AB (ref 96–106)
CO2: 25 mmol/L (ref 20–29)
Calcium: 9.5 mg/dL (ref 8.7–10.3)
Creatinine, Ser: 0.9 mg/dL (ref 0.57–1.00)
GFR calc Af Amer: 77 mL/min/{1.73_m2} (ref >59)
GFR calc non Af Amer: 66 mL/min/{1.73_m2} (ref >59)
GLOBULIN, TOTAL: 3.3 g/dL (ref 1.5–4.5)
Glucose: 118 mg/dL — ABNORMAL HIGH (ref 65–99)
POTASSIUM: 4.2 mmol/L (ref 3.5–5.2)
SODIUM: 147 mmol/L — AB (ref 134–144)
Total Protein: 7.2 g/dL (ref 6.0–8.5)

## 2017-09-25 LAB — LIPID PANEL
CHOLESTEROL TOTAL: 157 mg/dL (ref 100–199)
Chol/HDL Ratio: 4.9 ratio — ABNORMAL HIGH (ref 0.0–4.4)
HDL: 32 mg/dL — ABNORMAL LOW (ref >39)
LDL Calculated: 102 mg/dL — ABNORMAL HIGH (ref 0–99)
TRIGLYCERIDES: 113 mg/dL (ref 0–149)
VLDL Cholesterol Cal: 23 mg/dL (ref 5–40)

## 2017-09-26 ENCOUNTER — Telehealth: Payer: Self-pay | Admitting: Nurse Practitioner

## 2017-09-26 NOTE — Telephone Encounter (Signed)
Aware of results. 

## 2018-02-01 ENCOUNTER — Telehealth: Payer: Self-pay | Admitting: Nurse Practitioner

## 2018-03-27 ENCOUNTER — Encounter: Payer: Self-pay | Admitting: Nurse Practitioner

## 2018-03-27 ENCOUNTER — Ambulatory Visit (INDEPENDENT_AMBULATORY_CARE_PROVIDER_SITE_OTHER): Payer: Medicare Other | Admitting: Nurse Practitioner

## 2018-03-27 VITALS — BP 116/76 | HR 84 | Temp 98.0°F | Ht 68.0 in | Wt 210.0 lb

## 2018-03-27 DIAGNOSIS — E785 Hyperlipidemia, unspecified: Secondary | ICD-10-CM | POA: Diagnosis not present

## 2018-03-27 DIAGNOSIS — Z6828 Body mass index (BMI) 28.0-28.9, adult: Secondary | ICD-10-CM | POA: Diagnosis not present

## 2018-03-27 DIAGNOSIS — E559 Vitamin D deficiency, unspecified: Secondary | ICD-10-CM

## 2018-03-27 LAB — CMP14+EGFR
A/G RATIO: 1.3 (ref 1.2–2.2)
ALT: 10 IU/L (ref 0–32)
AST: 15 IU/L (ref 0–40)
Albumin: 4.1 g/dL (ref 3.6–4.8)
Alkaline Phosphatase: 128 IU/L — ABNORMAL HIGH (ref 39–117)
BILIRUBIN TOTAL: 0.4 mg/dL (ref 0.0–1.2)
BUN/Creatinine Ratio: 18 (ref 12–28)
BUN: 17 mg/dL (ref 8–27)
CALCIUM: 9.6 mg/dL (ref 8.7–10.3)
CO2: 24 mmol/L (ref 20–29)
Chloride: 105 mmol/L (ref 96–106)
Creatinine, Ser: 0.96 mg/dL (ref 0.57–1.00)
GFR, EST AFRICAN AMERICAN: 71 mL/min/{1.73_m2} (ref >59)
GFR, EST NON AFRICAN AMERICAN: 61 mL/min/{1.73_m2} (ref >59)
Globulin, Total: 3.1 g/dL (ref 1.5–4.5)
Glucose: 112 mg/dL — ABNORMAL HIGH (ref 65–99)
POTASSIUM: 4.7 mmol/L (ref 3.5–5.2)
SODIUM: 142 mmol/L (ref 134–144)
Total Protein: 7.2 g/dL (ref 6.0–8.5)

## 2018-03-27 LAB — LIPID PANEL
Chol/HDL Ratio: 5.6 ratio — ABNORMAL HIGH (ref 0.0–4.4)
Cholesterol, Total: 167 mg/dL (ref 100–199)
HDL: 30 mg/dL — ABNORMAL LOW (ref >39)
LDL Calculated: 116 mg/dL — ABNORMAL HIGH (ref 0–99)
Triglycerides: 107 mg/dL (ref 0–149)
VLDL Cholesterol Cal: 21 mg/dL (ref 5–40)

## 2018-03-27 MED ORDER — ATORVASTATIN CALCIUM 40 MG PO TABS: 40.0000 mg | ORAL_TABLET | Freq: Every day | ORAL | 1 refills | Status: DC

## 2018-03-27 NOTE — Patient Instructions (Signed)
Fat and Cholesterol Restricted Diet High levels of fat and cholesterol in your blood may lead to various health problems, such as diseases of the heart, blood vessels, gallbladder, liver, and pancreas. Fats are concentrated sources of energy that come in various forms. Certain types of fat, including saturated fat, may be harmful in excess. Cholesterol is a substance needed by your body in small amounts. Your body makes all the cholesterol it needs. Excess cholesterol comes from the food you eat. When you have high levels of cholesterol and saturated fat in your blood, health problems can develop because the excess fat and cholesterol will gather along the walls of your blood vessels, causing them to narrow. Choosing the right foods will help you control your intake of fat and cholesterol. This will help keep the levels of these substances in your blood within normal limits and reduce your risk of disease. What is my plan? Your health care provider recommends that you:  Limit your fat intake to ______% or less of your total calories per day.  Limit the amount of cholesterol in your diet to less than _________mg per day.  Eat 20-30 grams of fiber each day.  What types of fat should I choose?  Choose healthy fats more often. Choose monounsaturated and polyunsaturated fats, such as olive and canola oil, flaxseeds, walnuts, almonds, and seeds.  Eat more omega-3 fats. Good choices include salmon, mackerel, sardines, tuna, flaxseed oil, and ground flaxseeds. Aim to eat fish at least two times a week.  Limit saturated fats. Saturated fats are primarily found in animal products, such as meats, butter, and cream. Plant sources of saturated fats include palm oil, palm kernel oil, and coconut oil.  Avoid foods with partially hydrogenated oils in them. These contain trans fats. Examples of foods that contain trans fats are stick margarine, some tub margarines, cookies, crackers, and other baked goods. What  general guidelines do I need to follow? These guidelines for healthy eating will help you control your intake of fat and cholesterol:  Check food labels carefully to identify foods with trans fats or high amounts of saturated fat.  Fill one half of your plate with vegetables and green salads.  Fill one fourth of your plate with whole grains. Look for the word "whole" as the first word in the ingredient list.  Fill one fourth of your plate with lean protein foods.  Limit fruit to two servings a day. Choose fruit instead of juice.  Eat more foods that contain fiber, such as apples, broccoli, carrots, beans, peas, and barley.  Eat more home-cooked food and less restaurant, buffet, and fast food.  Limit or avoid alcohol.  Limit foods high in starch and sugar.  Limit fried foods.  Cook foods using methods other than frying. Baking, boiling, grilling, and broiling are all great options.  Lose weight if you are overweight. Losing just 5-10% of your initial body weight can help your overall health and prevent diseases such as diabetes and heart disease.  What foods can I eat? Grains  Whole grains, such as whole wheat or whole grain breads, crackers, cereals, and pasta. Unsweetened oatmeal, bulgur, barley, quinoa, or brown rice. Corn or whole wheat flour tortillas. Vegetables  Fresh or frozen vegetables (raw, steamed, roasted, or grilled). Green salads. Fruits  All fresh, canned (in natural juice), or frozen fruits. Meats and other protein foods  Ground beef (85% or leaner), grass-fed beef, or beef trimmed of fat. Skinless chicken or turkey. Ground chicken or turkey.   Pork trimmed of fat. All fish and seafood. Eggs. Dried beans, peas, or lentils. Unsalted nuts or seeds. Unsalted canned or dry beans. Dairy  Low-fat dairy products, such as skim or 1% milk, 2% or reduced-fat cheeses, low-fat ricotta or cottage cheese, or plain low-fat yo Fats and oils  Tub margarines without trans  fats. Light or reduced-fat mayonnaise and salad dressings. Avocado. Olive, canola, sesame, or safflower oils. Natural peanut or almond butter (choose ones without added sugar and oil). The items listed above may not be a complete list of recommended foods or beverages. Contact your dietitian for more options. Foods to avoid Grains  White bread. White pasta. White rice. Cornbread. Bagels, pastries, and croissants. Crackers that contain trans fat. Vegetables  White potatoes. Corn. Creamed or fried vegetables. Vegetables in a cheese sauce. Fruits  Dried fruits. Canned fruit in light or heavy syrup. Fruit juice. Meats and other protein foods  Fatty cuts of meat. Ribs, chicken wings, bacon, sausage, bologna, salami, chitterlings, fatback, hot dogs, bratwurst, and packaged luncheon meats. Liver and organ meats. Dairy  Whole or 2% milk, cream, half-and-half, and cream cheese. Whole milk cheeses. Whole-fat or sweetened yogurt. Full-fat cheeses. Nondairy creamers and whipped toppings. Processed cheese, cheese spreads, or cheese curds. Beverages  Alcohol. Sweetened drinks (such as sodas, lemonade, and fruit drinks or punches). Fats and oils  Butter, stick margarine, lard, shortening, ghee, or bacon fat. Coconut, palm kernel, or palm oils. Sweets and desserts  Corn syrup, sugars, honey, and molasses. Candy. Jam and jelly. Syrup. Sweetened cereals. Cookies, pies, cakes, donuts, muffins, and ice cream. The items listed above may not be a complete list of foods and beverages to avoid. Contact your dietitian for more information. This information is not intended to replace advice given to you by your health care provider. Make sure you discuss any questions you have with your health care provider. Document Released: 07/03/2005 Document Revised: 07/24/2014 Document Reviewed: 10/01/2013 Elsevier Interactive Patient Education  2018 [employer] Inc[employer]  

## 2018-03-27 NOTE — Progress Notes (Signed)
Subjective:    Patient ID: Lindsey Phillips, female    DOB: 05-28-1951, 67 y.o.   MRN: [id number]   Chief Complaint: Medical management olf chronic issues.  HPI:  1. Vitamin D deficiency  Patient is on a vitamind  Supplement daily. She does some weight bearing exercises a couple of times a week.  2. Hyperlipidemia with target LDL less than 100  Takes lipitor nightly. No c/o muscles aches. Has not really been watching her diet.  3. BMI 28.0-28.9,adult  Weight is unchanged form last visit    Outpatient Encounter Medications as of 03/27/2018  Medication Sig  . atorvastatin (LIPITOR) 40 MG tablet Take 1 tablet (40 mg total) by mouth daily.     New complaints: None today  Social history: Lives by herself but has relatives live close by.    Review of Systems  Constitutional: Negative for activity change and appetite change.  HENT: Negative.   Eyes: Negative for pain.  Respiratory: Negative for shortness of breath.   Cardiovascular: Negative for chest pain, palpitations and leg swelling.  Gastrointestinal: Negative for abdominal pain.  Endocrine: Negative for polydipsia.  Genitourinary: Negative.   Skin: Negative for rash.  Neurological: Negative for dizziness, weakness and headaches.  Hematological: Does not bruise/bleed easily.  Psychiatric/Behavioral: Negative.   All other systems reviewed and are negative.      Objective:   Physical Exam  Constitutional: She is oriented to person, place, and time. She appears well-developed and well-nourished. No distress.  HENT:  Head: Normocephalic.  Nose: Nose normal.  Mouth/Throat: Oropharynx is clear and moist.  Eyes: Pupils are equal, round, and reactive to light. EOM are normal.  Neck: Normal range of motion. Neck supple. No JVD present. Carotid bruit is not present.  Cardiovascular: Normal rate, regular rhythm, normal heart sounds and intact distal pulses.  Pulmonary/Chest: Effort normal and breath sounds normal. No  respiratory distress. She has no wheezes. She has no rales. She exhibits no tenderness.  Abdominal: Soft. Normal appearance, normal aorta and bowel sounds are normal. She exhibits no distension, no abdominal bruit, no pulsatile midline mass and no mass. There is no splenomegaly or hepatomegaly. There is no tenderness.  Musculoskeletal: Normal range of motion. She exhibits no edema.  Lymphadenopathy:    She has no cervical adenopathy.  Neurological: She is alert and oriented to person, place, and time. She has normal reflexes.  Skin: Skin is warm and dry.  Psychiatric: She has a normal mood and affect. Her behavior is normal. Judgment and thought content normal.  Nursing note and vitals reviewed.  BP 116/76   Pulse 84   Temp 98 F (36.7 C)   Ht '5\' 8"'$  (1.727 m)   Wt 210 lb (95.3 kg)   BMI 31.93 kg/m         Assessment & Plan:  Lindsey Phillips comes in today with chief complaint of Hyperlipidemia (six month recheck)   Diagnosis and orders addressed:  1. Vitamin D deficiency Continue daily vitamin d supplement  2. Hyperlipidemia with target LDL less than 100 Low fat diet - atorvastatin (LIPITOR) 40 MG tablet; Take 1 tablet (40 mg total) by mouth daily.  Dispense: 90 tablet; Refill: 1 - CMP14+EGFR - Lipid panel  3. BMI 28.0-28.9,adult Discussed diet and exercise for person with BMI >25 Will recheck weight in 3-6 months   Patient will schedule mammogram on way out today. Labs pending Health Maintenance reviewed Diet and exercise encouraged  Follow up plan: 6 months  Mary-Margaret Hassell Done, FNP

## 2018-04-09 ENCOUNTER — Ambulatory Visit (INDEPENDENT_AMBULATORY_CARE_PROVIDER_SITE_OTHER): Payer: Medicare Other | Admitting: Family Medicine

## 2018-04-09 VITALS — BP 121/78 | HR 90 | Temp 98.1°F | Ht 68.0 in | Wt 211.0 lb

## 2018-04-09 DIAGNOSIS — M25571 Pain in right ankle and joints of right foot: Secondary | ICD-10-CM

## 2018-04-09 MED ORDER — DICLOFENAC SODIUM 75 MG PO TBEC: 75.0000 mg | DELAYED_RELEASE_TABLET | Freq: Two times a day (BID) | ORAL | 0 refills | Status: DC

## 2018-04-09 NOTE — Progress Notes (Signed)
Subjective: CC: right ankle pain PCP: Bennie Pierini, FNP ZOX:WRUEA BIJAL SIGLIN is a 67 y.o. female presenting to clinic today for:  1. Right ankle pain Patient reports a few day history of right ankle pain and edema.  She points to the lower Achilles tendon and right anterior ankle over the ATFL.  She denies any preceding injury.  She notes that she was actually working in the yard over the weekend when she woke up the next morning with the above symptoms.  Denies any numbness tingling.  She reports pain is worse with ambulation.  She has been using an OTC topical analgesic with little improvement in symptoms.   ROS: Per HPI  No Known Allergies Past Medical History:  Diagnosis Date  . Arthritis   . Hyperlipidemia   . Vitamin D deficiency     Current Outpatient Medications:  .  atorvastatin (LIPITOR) 40 MG tablet, Take 1 tablet (40 mg total) by mouth daily., Disp: 90 tablet, Rfl: 1  Current Facility-Administered Medications:  .  0.9 %  sodium chloride infusion, 500 mL, Intravenous, Continuous, Rachael Fee, MD Social History   Socioeconomic History  . Marital status: Married    Spouse name: Not on file  . Number of children: Not on file  . Years of education: Not on file  . Highest education level: Not on file  Occupational History  . Not on file  Social Needs  . Financial resource strain: Not on file  . Food insecurity:    Worry: Not on file    Inability: Not on file  . Transportation needs:    Medical: Not on file    Non-medical: Not on file  Tobacco Use  . Smoking status: Former Smoker    Packs/day: 0.25    Years: 15.00    Pack years: 3.75    Types: Cigarettes    Last attempt to quit: 07/17/2013    Years since quitting: 4.7  . Smokeless tobacco: Never Used  Substance and Sexual Activity  . Alcohol use: No  . Drug use: No  . Sexual activity: Yes  Lifestyle  . Physical activity:    Days per week: Not on file    Minutes per session: Not on file  .  Stress: Not on file  Relationships  . Social connections:    Talks on phone: Not on file    Gets together: Not on file    Attends religious service: Not on file    Active member of club or organization: Not on file    Attends meetings of clubs or organizations: Not on file    Relationship status: Not on file  . Intimate partner violence:    Fear of current or ex partner: Not on file    Emotionally abused: Not on file    Physically abused: Not on file    Forced sexual activity: Not on file  Other Topics Concern  . Not on file  Social History Narrative  . Not on file   Family History  Problem Relation Age of Onset  . Hypertension Mother   . Diabetes Mother   . Heart disease Mother   . Kidney disease Mother   . Diabetes Father   . Diabetes Brother   . Hypertension Brother   . Diabetes Brother   . Hypertension Brother   . Colon cancer Neg Hx     Objective: Office vital signs reviewed. BP 121/78   Pulse 90   Temp 98.1 F (36.7 C) (  Oral)   Ht 5\' 8"  (1.727 m)   Wt 211 lb (95.7 kg)   BMI 32.08 kg/m   Physical Examination:  General: Awake, alert, wel nourished, No acute distress Extremities: warm, well perfused, trace ankle edema along the right. No cyanosis or clubbing; +2 pulses bilaterally MSK: antalgic gait and station  Right ankle: Mild soft tissue swelling appreciated over the ATFL and lateral ankle.  She has tenderness to palpation along the lower Achilles tendon.  There is a palpable knot over this area.  She has mild tenderness to palpation over the ATFL.  She has full active range of motion all planes.  No appreciable ligamentous laxity.  She has plantar flexion w/ Janee Mornhompson test. Skin: dry; intact; no rashes or lesions Neuro: 5/5 ankle Strength and light touch sensation grossly intact  Assessment/ Plan: 67 y.o. female   1. Acute right ankle pain Concern for tendinitis, particularly over the Achilles tendon.  Doubt Achilles rupture given plantar flexion with  Thompson test.  I have placed her in an ankle brace, recommended rice.  I have placed her on Voltaren p.o. twice daily as needed pain and swelling.  I have recommended that she follow-up in the next 7 to 14 days for recheck, sooner if needed.  If symptoms are not improving or have progressed, low threshold for referral to orthopedics for direct visualization under ultrasound.  Meds ordered this encounter  Medications  . diclofenac (VOLTAREN) 75 MG EC tablet    Sig: Take 1 tablet (75 mg total) by mouth 2 (two) times daily.    Dispense:  30 tablet    Refill:  0     Ashly Hulen SkainsM Gottschalk, DO [facility] [phone]

## 2018-04-09 NOTE — Patient Instructions (Signed)
You have prescribed a nonsteroidal anti-inflammatory drug (NSAID) today. This will help with your pain and inflammation. Please do not take any other NSAIDs (ibuprofen/Motrin/Advil, naproxen/Aleve, meloxicam/Mobic, Voltaren/diclofenac). Please make sure to eat a meal when taking this medication.   Caution:  If you have a history of acid reflux/indigestion, I recommend that you take an antacid (such as Prilosec, Prevacid) daily while on the NSAID.  If you have a history of bleeding disorder, gastric ulcer, are on a blood thinner (like warfarin/Coumadin, Xarelto, Eliquis, etc) please do not take NSAID.  If you have ever had a heart attack, you should not take NSAIDs.   Achilles Tendinitis Achilles tendinitis is inflammation of the tough, cord-like band that attaches the lower leg muscles to the heel bone (Achilles tendon). This is usually caused by overusing the tendon and the ankle joint. Achilles tendinitis usually gets better over time with treatment and caring for yourself at home. It can take weeks or months to heal completely. What are the causes? This condition may be caused by:  A sudden increase in exercise or activity, such as running.  Doing the same exercises or activities (such as jumping) over and over.  Not warming up calf muscles before exercising.  Exercising in shoes that are worn out or not made for exercise.  Having arthritis or a bone growth (spur) on the back of the heel bone. This can rub against the tendon and hurt it.  Age-related wear and tear. Tendons become less flexible with age and more likely to be injured.  What are the signs or symptoms? Common symptoms of this condition include:  Pain in the Achilles tendon or in the back of the leg, just above the heel. The pain usually gets worse with exercise.  Stiffness or soreness in the back of the leg, especially in the morning.  Swelling of the skin over the Achilles tendon.  Thickening of the  tendon.  Bone spurs at the bottom of the Achilles tendon, near the heel.  Trouble standing on tiptoe.  How is this diagnosed? This condition is diagnosed based on your symptoms and a physical exam. You may have tests, including:  X-rays.  MRI.  How is this treated? The goal of treatment is to relieve symptoms and help your injury heal. Treatment may include:  Decreasing or stopping activities that caused the tendinitis. This may mean switching to low-impact exercises like biking or swimming.  Icing the injured area.  Doing physical therapy, including strengthening and stretching exercises.  NSAIDs to help relieve pain and swelling.  Using supportive shoes, wraps, heel lifts, or a walking boot (air cast).  Surgery. This may be done if your symptoms do not improve after 6 months.  Using high-energy shock wave impulses to stimulate the healing process (extracorporeal shock wave therapy). This is rare.  Injection of medicines to help relieve inflammation (corticosteroids). This is rare.  Follow these instructions at home: If you have an air cast:  Wear the cast as told by your health care provider. Remove it only as told by your health care provider.  Loosen the cast if your toes tingle, become numb, or turn cold and blue. Activity  Gradually return to your normal activities once your health care provider approves. Do not do activities that cause pain. ? Consider doing low-impact exercises, like cycling or swimming.  If you have an air cast, ask your health care provider when it is safe for you to drive.  If physical therapy was prescribed, do  exercises as told by your health care provider or physical therapist. Managing pain, stiffness, and swelling  Raise (elevate) your foot above the level of your heart while you are sitting or lying down.  Move your toes often to avoid stiffness and to lessen swelling.  If directed, put ice on the injured area: ? Put ice in a  plastic bag. ? Place a towel between your skin and the bag. ? Leave the ice on for 20 minutes, 2-3 times a day General instructions  If directed, wrap your foot with an elastic bandage or other wrap. This can help keep your tendon from moving too much while it heals. Your health care provider will show you how to wrap your foot correctly.  Wear supportive shoes or heel lifts only as told by your health care provider.  Take over-the-counter and prescription medicines only as told by your health care provider.  Keep all follow-up visits as told by your health care provider. This is important. Contact a health care provider if:  You have symptoms that gets worse.  You have pain that does not get better with medicine.  You develop new, unexplained symptoms.  You develop warmth and swelling in your foot.  You have a fever. Get help right away if:  You have a sudden popping sound or sensation in your Achilles tendon followed by severe pain.  You cannot move your toes or foot.  You cannot put any weight on your foot. Summary  Achilles tendinitis is inflammation of the tough, cord-like band that attaches the lower leg muscles to the heel bone (Achilles tendon).  This condition is usually caused by overusing the tendon and the ankle joint. It can also be caused by arthritis or normal aging.  The most common symptoms of this condition include pain, swelling, or stiffness in the Achilles tendon or in the back of the leg.  This condition is usually treated with rest, NSAIDs, and physical therapy. This information is not intended to replace advice given to you by your health care provider. Make sure you discuss any questions you have with your health care provider. Document Released: 04/12/2005 Document Revised: 05/22/2016 Document Reviewed: 05/22/2016 Elsevier Interactive Patient Education  2017 [employer]

## 2018-04-16 ENCOUNTER — Encounter: Payer: Self-pay | Admitting: Family Medicine

## 2018-04-16 ENCOUNTER — Ambulatory Visit (INDEPENDENT_AMBULATORY_CARE_PROVIDER_SITE_OTHER): Payer: Medicare Other | Admitting: Family Medicine

## 2018-04-16 VITALS — BP 121/77 | HR 82 | Temp 97.8°F | Ht 68.0 in | Wt 212.0 lb

## 2018-04-16 DIAGNOSIS — M25571 Pain in right ankle and joints of right foot: Secondary | ICD-10-CM | POA: Diagnosis not present

## 2018-04-16 DIAGNOSIS — Z23 Encounter for immunization: Secondary | ICD-10-CM

## 2018-04-16 NOTE — Patient Instructions (Signed)
I am so glad to hear that your ankle and heel are feeling better.  As we discussed, the diclofenac is for pain and inflammation.  You may use this if needed in the future for pain and inflammation.  You got your flu shot today.

## 2018-04-16 NOTE — Progress Notes (Signed)
Subjective: CC: ankle PCP: Bennie Pierini, FNP Lindsey Phillips is a 67 y.o. female presenting to clinic today for:  1. Ankle pain Patient was seen 1 week ago for ankle and heel pain.  She was thought to have an Achilles tendinitis.  She was placed in an ankle brace and given diclofenac to take up to twice daily if needed for pain and inflammation.  She reports that after about 3 days pain has essentially has resolved.  She has discontinued use of brace.  She continues to take diclofenac intermittently.  She has an ankle sleeve to use if needed.   ROS: Per HPI  No Known Allergies Past Medical History:  Diagnosis Date  . Arthritis   . Hyperlipidemia   . Vitamin D deficiency     Current Outpatient Medications:  .  atorvastatin (LIPITOR) 40 MG tablet, Take 1 tablet (40 mg total) by mouth daily., Disp: 90 tablet, Rfl: 1 .  diclofenac (VOLTAREN) 75 MG EC tablet, Take 1 tablet (75 mg total) by mouth 2 (two) times daily., Disp: 30 tablet, Rfl: 0  Current Facility-Administered Medications:  .  0.9 %  sodium chloride infusion, 500 mL, Intravenous, Continuous, Rachael Fee, MD Social History   Socioeconomic History  . Marital status: Married    Spouse name: Not on file  . Number of children: Not on file  . Years of education: Not on file  . Highest education level: Not on file  Occupational History  . Not on file  Social Needs  . Financial resource strain: Not on file  . Food insecurity:    Worry: Not on file    Inability: Not on file  . Transportation needs:    Medical: Not on file    Non-medical: Not on file  Tobacco Use  . Smoking status: Former Smoker    Packs/day: 0.25    Years: 15.00    Pack years: 3.75    Types: Cigarettes    Last attempt to quit: 07/17/2013    Years since quitting: 4.7  . Smokeless tobacco: Never Used  Substance and Sexual Activity  . Alcohol use: No  . Drug use: No  . Sexual activity: Yes  Lifestyle  . Physical activity:    Days  per week: Not on file    Minutes per session: Not on file  . Stress: Not on file  Relationships  . Social connections:    Talks on phone: Not on file    Gets together: Not on file    Attends religious service: Not on file    Active member of club or organization: Not on file    Attends meetings of clubs or organizations: Not on file    Relationship status: Not on file  . Intimate partner violence:    Fear of current or ex partner: Not on file    Emotionally abused: Not on file    Physically abused: Not on file    Forced sexual activity: Not on file  Other Topics Concern  . Not on file  Social History Narrative  . Not on file   Family History  Problem Relation Age of Onset  . Hypertension Mother   . Diabetes Mother   . Heart disease Mother   . Kidney disease Mother   . Diabetes Father   . Diabetes Brother   . Hypertension Brother   . Diabetes Brother   . Hypertension Brother   . Colon cancer Neg Hx     Objective:  Office vital signs reviewed. BP 121/77   Pulse 82   Temp 97.8 F (36.6 C) (Oral)   Ht 5\' 8"  (1.727 m)   Wt 212 lb (96.2 kg)   BMI 32.23 kg/m   Physical Examination:  General: Awake, alert, well nourished, No acute distress Extremities: warm, well perfused, No edema, cyanosis or clubbing; +2 pulses bilaterally MSK: normal gait and station  Right ankle: Patient has full active range of motion in all planes.  No tenderness to palpation over the ATFL or the Achilles tendon.  Soft tissue swelling appreciated at the base of the Achilles tendon has now resolved.  No discoloration. Skin: dry; intact; no rashes or lesions Neuro: light touch sensation in tact  Assessment/ Plan: 67 y.o. female   1. Acute right ankle pain Resolved.  I instructed her to discontinue use of diclofenac and have on hand for as needed use going forward.  Follow-up with PCP for ongoing needs.  2. Need for influenza vaccination Influenza vaccination administered today.   Raliegh Ip, DO [facility] [phone]

## 2018-05-14 ENCOUNTER — Ambulatory Visit (INDEPENDENT_AMBULATORY_CARE_PROVIDER_SITE_OTHER): Payer: Medicare Other | Admitting: *Deleted

## 2018-05-14 ENCOUNTER — Encounter: Payer: Self-pay | Admitting: *Deleted

## 2018-05-14 VITALS — BP 123/79 | HR 82 | Ht 68.0 in | Wt 211.0 lb

## 2018-05-14 DIAGNOSIS — Z0000 Encounter for general adult medical examination without abnormal findings: Secondary | ICD-10-CM

## 2018-05-14 LAB — HM MAMMOGRAPHY

## 2018-05-14 NOTE — Progress Notes (Addendum)
Subjective:   Lindsey Phillips is a 67 y.o. female who presents for Medicare Annual (Subsequent) preventive examination.  Lindsey Phillips worked at [employer] for 42 years and [employer] for 1 year, until she retired at age 62.  She enjoys visiting her family, reading, walking, and doing yard work.  She is active in her church.  Lindsey Phillips is separated from her husband, and lives alone.  She has one daughter who lives in [place], [place].  She feels her health is unchanged from last year.  She reports no surgeries, ER visits, or hospitalizations in the past year.   Review of Systems:   All negative today     Objective:     Vitals: BP 123/79   Pulse 82   Ht 5\' 8"  (1.727 m)   Wt 211 lb (95.7 kg)   BMI 32.08 kg/m   Body mass index is 32.08 kg/m.  Advanced Directives 05/14/2018 02/28/2017 12/27/2016 07/03/2016 07/02/2016 07/02/2016 07/01/2016  Does Patient Have a Medical Advance Directive? No No No No No No No  Would patient like information on creating a medical advance directive? No - Patient declined - Yes (ED - Information included in AVS) - No - Patient declined No - Patient declined No - Patient declined    Tobacco Social History   Tobacco Use  Smoking Status Former Smoker  . Packs/day: 0.25  . Years: 15.00  . Pack years: 3.75  . Types: Cigarettes  . Last attempt to quit: 07/17/2013  . Years since quitting: 4.8  Smokeless Tobacco Never Used     Counseling given: No   Clinical Intake:     Pain Score: 0-No pain                 Past Medical History:  Diagnosis Date  . Arthritis   . Hyperlipidemia   . Vitamin D deficiency    Past Surgical History:  Procedure Laterality Date  . ABDOMINAL HYSTERECTOMY    . KNEE ARTHROSCOPY Right 07/03/2016   Procedure: RIGHT KNEE ARTHROSCOPY;  Surgeon: Vickki Hearing, MD;  Location: [facility];  Service: Orthopedics;  Laterality: Right;   Family History  Problem Relation Age of Onset  . Hypertension Mother   . Diabetes  Mother   . Heart disease Mother   . Kidney disease Mother   . Diabetes Father   . Hypertension Sister   . Diabetes Brother   . Hypertension Brother   . Hypertension Sister   . Diabetes Brother   . Hypertension Brother   . Diabetes Brother   . Hypertension Brother   . Diabetes Brother   . Kidney disease Brother   . Colon cancer Neg Hx    Social History   Socioeconomic History  . Marital status: Married    Spouse name: Not on file  . Number of children: Not on file  . Years of education: Not on file  . Highest education level: Not on file  Occupational History  . Not on file  Social Needs  . Financial resource strain: Not on file  . Food insecurity:    Worry: Not on file    Inability: Not on file  . Transportation needs:    Medical: Not on file    Non-medical: Not on file  Tobacco Use  . Smoking status: Former Smoker    Packs/day: 0.25    Years: 15.00    Pack years: 3.75    Types: Cigarettes    Last attempt to  quit: 07/17/2013    Years since quitting: 4.8  . Smokeless tobacco: Never Used  Substance and Sexual Activity  . Alcohol use: No  . Drug use: No  . Sexual activity: Not on file  Lifestyle  . Physical activity:    Days per week: Not on file    Minutes per session: Not on file  . Stress: Not on file  Relationships  . Social connections:    Talks on phone: Not on file    Gets together: Not on file    Attends religious service: Not on file    Active member of club or organization: Not on file    Attends meetings of clubs or organizations: Not on file    Relationship status: Not on file  Other Topics Concern  . Not on file  Social History Narrative  . Not on file    Outpatient Encounter Medications as of 05/14/2018  Medication Sig  . atorvastatin (LIPITOR) 40 MG tablet Take 1 tablet (40 mg total) by mouth daily.  . Cholecalciferol (VITAMIN D3) 1000 units CAPS Take 1 capsule by mouth daily.  . diclofenac (VOLTAREN) 75 MG EC tablet Take 1 tablet (75 mg  total) by mouth 2 (two) times daily. (Patient not taking: Reported on 05/14/2018)   Facility-Administered Encounter Medications as of 05/14/2018  Medication  . 0.9 %  sodium chloride infusion    Activities of Daily Living In your present state of health, do you have any difficulty performing the following activities: 05/14/2018  Hearing? N  Vision? N  Difficulty concentrating or making decisions? N  Walking or climbing stairs? N  Dressing or bathing? N  Doing errands, shopping? N  Preparing Food and eating ? N  Using the Toilet? N  In the past six months, have you accidently leaked urine? N  Do you have problems with loss of bowel control? N  Managing your Medications? N  Managing your Finances? N  Housekeeping or managing your Housekeeping? N  Some recent data might be hidden    Patient Care Team: Bennie Pierini, FNP as PCP - General (Nurse Practitioner) Vickki Hearing, MD as Consulting Physician (Orthopedic Surgery)    Assessment:   This is a routine wellness examination for Lindsey Phillips.  Exercise Activities and Dietary recommendations  [name] states she usually has 2-3 meals per day.  Breakfast- boiled eggs and toast, lunch- salad or granola bar, supper- meat and vegetables.  Recommended diet of mostly vegetables, fruits, lean proteins, and whole grains.  Health cooking methods discusses.   Current Exercise Habits: Home exercise routine, Type of exercise: walking, Time (Minutes): 25, Frequency (Times/Week): 2, Weekly Exercise (Minutes/Week): 50, Intensity: Mild  Goals    . Exercise 3x per week (30 min per time)     [employer] and include participating in [employer] group or stationary bike/recumbent stepper in current exercise regimen.      . Exercise 3x per week (30 min per time)     Walk 3 times per week for 30 minutes each session.    . Increase lean proteins     Decrease intake of processed meat like bacon and sausage - increase intake of lean protein  such as [place], chicken, fish- broiled, grilled, baked.  Avoid frying foods.       Fall Risk Fall Risk  05/14/2018 04/16/2018 04/09/2018 03/27/2018 09/24/2017  Falls in the past year? No No No No No   Is the patient's home free of loose throw rugs in walkways,  pet beds, electrical cords, etc?   yes      Grab bars in the bathroom? no      Handrails on the stairs?   no stairs in home      Adequate lighting?   yes  Depression Screen PHQ 2/9 Scores 05/14/2018 04/16/2018 04/09/2018 03/27/2018  PHQ - 2 Score 0 0 0 0     Cognitive Function MMSE - Mini Mental State Exam 05/14/2018 12/27/2016  Orientation to time 5 5  Orientation to Place 5 5  Registration 3 3  Attention/ Calculation 5 2  Recall 2 0  Language- name 2 objects 2 2  Language- repeat 1 1  Language- follow 3 step command 3 3  Language- read & follow direction 1 1  Write a sentence 1 1  Copy design 1 0  Total score 29 23        Immunization History  Administered Date(s) Administered  . Influenza, High Dose Seasonal PF 05/07/2017  . Influenza,inj,Quad PF,6+ Mos 04/16/2018  . Influenza-Unspecified 04/16/2016  . Pneumococcal Conjugate-13 11/18/2015  . Pneumococcal Polysaccharide-23 12/27/2016      Qualifies for Shingles Vaccine? Declined today  Screening Tests Health Maintenance  Topic Date Due  . MAMMOGRAM  02/13/2018  . TETANUS/TDAP  06/26/2018 (Originally 08/23/1969)  . COLONOSCOPY  03/15/2027  . INFLUENZA VACCINE  Completed  . DEXA SCAN  Completed  . Hepatitis C Screening  Completed  . PNA vac Low Risk Adult  Completed   tdap declined today Mammogram done today   Cancer Screenings: Lung: Low Dose CT Chest recommended if Age 32-80 years, 30 pack-year currently smoking OR have quit w/in 15years. Patient does qualify. Breast:  Up to date on Mammogram? Yes   Up to date of Bone Density/Dexa? Yes Colorectal: up to date  Additional Screenings:  Hepatitis C Screening:  Completed 11/18/15     Plan:     Work  on your goal of increasing your exercise to 3 times per week for 30 minutes each session.  Walking is a great option. Follow up with Bennie Pierini, FNP as scheduled.   I have personally reviewed and noted the following in the patient's chart:   . Medical and social history . Use of alcohol, tobacco or illicit drugs  . Current medications and supplements . Functional ability and status . Nutritional status . Physical activity . Advanced directives . List of other physicians . Hospitalizations, surgeries, and ER visits in previous 12 months . Vitals . Screenings to include cognitive, depression, and falls . Referrals and appointments  In addition, I have reviewed and discussed with patient certain preventive protocols, quality metrics, and best practice recommendations. A written personalized care plan for preventive services as well as general preventive health recommendations were provided to patient.     Kirstan Fentress M, RN  05/14/2018  I have reviewed and agree with the above AWV documentation.   Mary-Margaret Daphine Deutscher, FNP

## 2018-05-14 NOTE — Patient Instructions (Addendum)
Please work on your goal of increasing your exercise to 3 times per week for 30 minutes each session.  Walking is a great option.  Please follow up with Chevis Pretty, FNP as scheduled.   Thank you for coming in for your Annual Wellness Visit today!!  Preventive Care 67 Years and Older, Female Preventive care refers to lifestyle choices and visits with your health care provider that can promote health and wellness. What does preventive care include?  A yearly physical exam. This is also called an annual well check.  Dental exams once or twice a year.  Routine eye exams. Ask your health care provider how often you should have your eyes checked.  Personal lifestyle choices, including: ? Daily care of your teeth and gums. ? Regular physical activity. ? Eating a healthy diet. ? Avoiding tobacco and drug use. ? Limiting alcohol use. ? Practicing safe sex. ? Taking low-dose aspirin every day. ? Taking vitamin and mineral supplements as recommended by your health care provider. What happens during an annual well check? The services and screenings done by your health care provider during your annual well check will depend on your age, overall health, lifestyle risk factors, and family history of disease. Counseling Your health care provider may ask you questions about your:  Alcohol use.  Tobacco use.  Drug use.  Emotional well-being.  Home and relationship well-being.  Sexual activity.  Eating habits.  History of falls.  Memory and ability to understand (cognition).  Work and work Statistician.  Reproductive health.  Screening You may have the following tests or measurements:  Height, weight, and BMI.  Blood pressure.  Lipid and cholesterol levels. These may be checked every 5 years, or more frequently if you are over 61 years old.  Skin check.  Lung cancer screening. You may have this screening every year starting at age 25 if you have a 30-pack-year  history of smoking and currently smoke or have quit within the past 15 years.  Fecal occult blood test (FOBT) of the stool. You may have this test every year starting at age 67.  Flexible sigmoidoscopy or colonoscopy. You may have a sigmoidoscopy every 5 years or a colonoscopy every 10 years starting at age 22.  Hepatitis C blood test.  Hepatitis B blood test.  Sexually transmitted disease (STD) testing.  Diabetes screening. This is done by checking your blood sugar (glucose) after you have not eaten for a while (fasting). You may have this done every 1-3 years.  Bone density scan. This is done to screen for osteoporosis. You may have this done starting at age 13.  Mammogram. This may be done every 1-2 years. Talk to your health care provider about how often you should have regular mammograms.  Talk with your health care provider about your test results, treatment options, and if necessary, the need for more tests. Vaccines Your health care provider may recommend certain vaccines, such as:  Influenza vaccine. This is recommended every year.  Tetanus, diphtheria, and acellular pertussis (Tdap, Td) vaccine. You may need a Td booster every 10 years.  Varicella vaccine. You may need this if you have not been vaccinated.  Zoster vaccine. You may need this after age 68.  Measles, mumps, and rubella (MMR) vaccine. You may need at least one dose of MMR if you were born in 1957 or later. You may also need a second dose.  Pneumococcal 13-valent conjugate (PCV13) vaccine. One dose is recommended after age 69.  Pneumococcal  polysaccharide (PPSV23) vaccine. One dose is recommended after age 45.  Meningococcal vaccine. You may need this if you have certain conditions.  Hepatitis A vaccine. You may need this if you have certain conditions or if you travel or work in places where you may be exposed to hepatitis A.  Hepatitis B vaccine. You may need this if you have certain conditions or if  you travel or work in places where you may be exposed to hepatitis B.  Haemophilus influenzae type b (Hib) vaccine. You may need this if you have certain conditions.  Talk to your health care provider about which screenings and vaccines you need and how often you need them. This information is not intended to replace advice given to you by your health care provider. Make sure you discuss any questions you have with your health care provider. Document Released: 07/30/2015 Document Revised: 03/22/2016 Document Reviewed: 05/04/2015 Elsevier Interactive Patient Education  [employer].

## 2018-09-23 ENCOUNTER — Encounter: Payer: Self-pay | Admitting: Nurse Practitioner

## 2018-09-23 ENCOUNTER — Ambulatory Visit (INDEPENDENT_AMBULATORY_CARE_PROVIDER_SITE_OTHER): Payer: Medicare Other | Admitting: Nurse Practitioner

## 2018-09-23 VITALS — BP 128/80 | HR 88 | Temp 96.8°F | Ht 68.0 in | Wt 209.0 lb

## 2018-09-23 DIAGNOSIS — E785 Hyperlipidemia, unspecified: Secondary | ICD-10-CM

## 2018-09-23 DIAGNOSIS — E559 Vitamin D deficiency, unspecified: Secondary | ICD-10-CM | POA: Diagnosis not present

## 2018-09-23 DIAGNOSIS — Z6828 Body mass index (BMI) 28.0-28.9, adult: Secondary | ICD-10-CM

## 2018-09-23 LAB — CMP14+EGFR
A/G RATIO: 1.2 (ref 1.2–2.2)
ALT: 16 IU/L (ref 0–32)
AST: 13 IU/L (ref 0–40)
Albumin: 4 g/dL (ref 3.8–4.8)
Alkaline Phosphatase: 132 IU/L — ABNORMAL HIGH (ref 39–117)
BUN/Creatinine Ratio: 17 (ref 12–28)
BUN: 18 mg/dL (ref 8–27)
Bilirubin Total: 0.3 mg/dL (ref 0.0–1.2)
CALCIUM: 10 mg/dL (ref 8.7–10.3)
CHLORIDE: 104 mmol/L (ref 96–106)
CO2: 24 mmol/L (ref 20–29)
Creatinine, Ser: 1.03 mg/dL — ABNORMAL HIGH (ref 0.57–1.00)
GFR, EST AFRICAN AMERICAN: 65 mL/min/{1.73_m2} (ref >59)
GFR, EST NON AFRICAN AMERICAN: 56 mL/min/{1.73_m2} — AB (ref >59)
Globulin, Total: 3.3 g/dL (ref 1.5–4.5)
Glucose: 118 mg/dL — ABNORMAL HIGH (ref 65–99)
POTASSIUM: 4.3 mmol/L (ref 3.5–5.2)
Sodium: 142 mmol/L (ref 134–144)
TOTAL PROTEIN: 7.3 g/dL (ref 6.0–8.5)

## 2018-09-23 LAB — LIPID PANEL
CHOL/HDL RATIO: 5.4 ratio — AB (ref 0.0–4.4)
Cholesterol, Total: 163 mg/dL (ref 100–199)
HDL: 30 mg/dL — ABNORMAL LOW (ref >39)
LDL Calculated: 111 mg/dL — ABNORMAL HIGH (ref 0–99)
TRIGLYCERIDES: 111 mg/dL (ref 0–149)
VLDL Cholesterol Cal: 22 mg/dL (ref 5–40)

## 2018-09-23 MED ORDER — ATORVASTATIN CALCIUM 40 MG PO TABS: 40.0000 mg | ORAL_TABLET | Freq: Every day | ORAL | 1 refills | Status: DC

## 2018-09-23 NOTE — Progress Notes (Signed)
Subjective:    Patient ID: Lindsey Phillips, female    DOB: 1950-11-16, 68 y.o.   MRN: [id number]   Chief Complaint: No chief complaint on file.   HPI:  1. Hyperlipidemia with target LDL less than 100  Tries not to eat a lot of fried foods. Does not do much exercise.  2. Vitamin D deficiency  Takes dialy vitamin d supplement  3. BMI 28.0-28.9,adult  No recent weight changes    Outpatient Encounter Medications as of 09/23/2018  Medication Sig  . atorvastatin (LIPITOR) 40 MG tablet Take 1 tablet (40 mg total) by mouth daily.  . Cholecalciferol (VITAMIN D3) 1000 units CAPS Take 1 capsule by mouth daily.  . diclofenac (VOLTAREN) 75 MG EC tablet Take 1 tablet (75 mg total) by mouth 2 (two) times daily. (Patient not taking: Reported on 05/14/2018)      New complaints: None today  Social history: Retired from [employer]- loves retirement   Review of Systems  Constitutional: Negative for activity change and appetite change.  HENT: Negative.   Eyes: Negative for pain.  Respiratory: Negative for shortness of breath.   Cardiovascular: Negative for chest pain, palpitations and leg swelling.  Gastrointestinal: Negative for abdominal pain.  Endocrine: Negative for polydipsia.  Genitourinary: Negative.   Skin: Negative for rash.  Neurological: Negative for dizziness, weakness and headaches.  Hematological: Does not bruise/bleed easily.  Psychiatric/Behavioral: Negative.   All other systems reviewed and are negative.      Objective:   Physical Exam Vitals signs and nursing note reviewed.  Constitutional:      General: She is not in acute distress.    Appearance: Normal appearance. She is well-developed.  HENT:     Head: Normocephalic.     Nose: Nose normal.  Eyes:     Pupils: Pupils are equal, round, and reactive to light.  Neck:     Musculoskeletal: Normal range of motion and neck supple.     Vascular: No carotid bruit or JVD.  Cardiovascular:     Rate and Rhythm: Normal rate  and regular rhythm.     Heart sounds: Normal heart sounds.  Pulmonary:     Effort: Pulmonary effort is normal. No respiratory distress.     Breath sounds: Normal breath sounds. No wheezing or rales.  Chest:     Chest wall: No tenderness.  Abdominal:     General: Bowel sounds are normal. There is no distension or abdominal bruit.     Palpations: Abdomen is soft. There is no hepatomegaly, splenomegaly, mass or pulsatile mass.     Tenderness: There is no abdominal tenderness.  Musculoskeletal: Normal range of motion.  Lymphadenopathy:     Cervical: No cervical adenopathy.  Skin:    General: Skin is warm and dry.  Neurological:     Mental Status: She is alert and oriented to person, place, and time.     Deep Tendon Reflexes: Reflexes are normal and symmetric.  Psychiatric:        Behavior: Behavior normal.        Thought Content: Thought content normal.        Judgment: Judgment normal.    BP 128/80   Pulse 88   Temp (!) 96.8 F (36 C) (Oral)   Ht 5' 8"  (1.727 m)   Wt 209 lb (94.8 kg)   BMI 31.78 kg/m       Assessment & Plan:  Lindsey Phillips comes in today with chief complaint of Medical Management of  Chronic Issues   Diagnosis and orders addressed:  1. Hyperlipidemia with target LDL less than 100 Low fat diet - atorvastatin (LIPITOR) 40 MG tablet; Take 1 tablet (40 mg total) by mouth daily.  Dispense: 90 tablet; Refill: 1 - CMP14+EGFR - Lipid panel  2. Vitamin D deficiency continue daily vitamin d supplement Weight bearing exercises  3. BMI 28.0-28.9,adult Discussed diet and exercise for person with BMI >25 Will recheck weight in 3-6 months    Labs pending Health Maintenance reviewed Diet and exercise encouraged  Follow up plan: 6 months   Lindsey Hassell Done, FNP

## 2018-09-23 NOTE — Patient Instructions (Signed)

## 2018-09-24 ENCOUNTER — Telehealth: Payer: Self-pay | Admitting: Nurse Practitioner

## 2018-09-24 NOTE — Telephone Encounter (Signed)
Aware of results. 

## 2019-03-27 ENCOUNTER — Encounter: Payer: Self-pay | Admitting: Nurse Practitioner

## 2019-03-27 ENCOUNTER — Ambulatory Visit (INDEPENDENT_AMBULATORY_CARE_PROVIDER_SITE_OTHER): Payer: Medicare Other | Admitting: Nurse Practitioner

## 2019-03-27 DIAGNOSIS — E559 Vitamin D deficiency, unspecified: Secondary | ICD-10-CM | POA: Diagnosis not present

## 2019-03-27 DIAGNOSIS — Z6828 Body mass index (BMI) 28.0-28.9, adult: Secondary | ICD-10-CM | POA: Diagnosis not present

## 2019-03-27 DIAGNOSIS — E785 Hyperlipidemia, unspecified: Secondary | ICD-10-CM

## 2019-03-27 MED ORDER — ATORVASTATIN CALCIUM 40 MG PO TABS: 40.0000 mg | ORAL_TABLET | Freq: Every day | ORAL | 1 refills | Status: DC

## 2019-03-27 NOTE — Progress Notes (Signed)
Virtual Visit via telephone Note Due to COVID-19 pandemic this visit was conducted virtually. This visit type was conducted due to national recommendations for restrictions regarding the COVID-19 Pandemic (e.g. social distancing, sheltering in place) in an effort to limit this patient's exposure and mitigate transmission in our community. All issues noted in this document were discussed and addressed.  A physical exam was not performed with this format.  I connected with Lindsey Phillips on 03/27/19 at 10 30 by telephone and verified that I am speaking with the correct person using two identifiers. Lindsey Phillips is currently located in her car and no one is currently with her during visit. The provider, Mary-Margaret Hassell Done, FNP is located in their office at time of visit.  I discussed the limitations, risks, security and privacy concerns of performing an evaluation and management service by telephone and the availability of in person appointments. I also discussed with the patient that there may be a patient responsible charge related to this service. The patient expressed understanding and agreed to proceed.   History and Present Illness:   Chief Complaint: Medical Management of Chronic Issues    HPI:  1. Hyperlipidemia with target LDL less than 100 Tries to avoid most fried foods. Does try to walk some several days a week. Lab Results  Component Value Date   CHOL 163 09/23/2018   HDL 30 (L) 09/23/2018   LDLCALC 111 (H) 09/23/2018   TRIG 111 09/23/2018   CHOLHDL 5.4 (H) 09/23/2018     2. Vitamin D deficiency Is on daily dose of vitamin d   3. BMI 28.0-28.9,adult No recent weight changes    Outpatient Encounter Medications as of 03/27/2019  Medication Sig  . atorvastatin (LIPITOR) 40 MG tablet Take 1 tablet (40 mg total) by mouth daily.  . Cholecalciferol (VITAMIN D3) 1000 units CAPS Take 1 capsule by mouth daily.  . diclofenac (VOLTAREN) 75 MG EC tablet Take 1 tablet (75 mg  total) by mouth 2 (two) times daily. (Patient not taking: Reported on 05/14/2018)     Past Surgical History:  Procedure Laterality Date  . ABDOMINAL HYSTERECTOMY    . KNEE ARTHROSCOPY Right 07/03/2016   Procedure: RIGHT KNEE ARTHROSCOPY;  Surgeon: Carole Civil, MD;  Location: [facility];  Service: Orthopedics;  Laterality: Right;    Family History  Problem Relation Age of Onset  . Hypertension Mother   . Diabetes Mother   . Heart disease Mother   . Kidney disease Mother   . Diabetes Father   . Hypertension Sister   . Diabetes Brother   . Hypertension Brother   . Hypertension Sister   . Diabetes Brother   . Hypertension Brother   . Diabetes Brother   . Hypertension Brother   . Diabetes Brother   . Kidney disease Brother   . Colon cancer Neg Hx     New complaints: None today  Social history: Lives by herself  Controlled substance contract: N/A    Review of Systems  Constitutional: Negative for diaphoresis and weight loss.  Eyes: Negative for blurred vision, double vision and pain.  Respiratory: Negative for shortness of breath.   Cardiovascular: Negative for chest pain, palpitations, orthopnea and leg swelling.  Gastrointestinal: Negative for abdominal pain.  Skin: Negative for rash.  Neurological: Negative for dizziness, sensory change, loss of consciousness, weakness and headaches.  Endo/Heme/Allergies: Negative for polydipsia. Does not bruise/bleed easily.  Psychiatric/Behavioral: Negative for memory loss. The patient does not have insomnia.  All other systems reviewed and are negative.    Observations/Objective: Alert and oriented- answers all questions appropriately No distress  Assessment and Plan: Clearence PedWanda G Pung comes in today with chief complaint of Medical Management of Chronic Issues   Diagnosis and orders addressed:  1. Hyperlipidemia with target LDL less than 100 Low fat diet - atorvastatin (LIPITOR) 40 MG tablet; Take 1 tablet (40 mg  total) by mouth daily.  Dispense: 90 tablet; Refill: 1  2. Vitamin D deficiency Continue daily vitamin d supplement  3. BMI 28.0-28.9,adult Discussed diet and exercise for person with BMI >25 Will recheck weight in 3-6 months   Previous labs reviewed Health Maintenance reviewed Diet and exercise encouraged  Follow up plan: 3 months    I discussed the assessment and treatment plan with the patient. The patient was provided an opportunity to ask questions and all were answered. The patient agreed with the plan and demonstrated an understanding of the instructions.   The patient was advised to call back or seek an in-person evaluation if the symptoms worsen or if the condition fails to improve as anticipated.  The above assessment and management plan was discussed with the patient. The patient verbalized understanding of and has agreed to the management plan. Patient is aware to call the clinic if symptoms persist or worsen. Patient is aware when to return to the clinic for a follow-up visit. Patient educated on when it is appropriate to go to the emergency department.   Time call ended:  10:42  I provided 12 minutes of non-face-to-face time during this encounter.    Mary-Margaret Daphine DeutscherMartin, FNP

## 2019-05-27 ENCOUNTER — Ambulatory Visit (INDEPENDENT_AMBULATORY_CARE_PROVIDER_SITE_OTHER): Payer: Medicare Other | Admitting: *Deleted

## 2019-05-27 DIAGNOSIS — Z0000 Encounter for general adult medical examination without abnormal findings: Secondary | ICD-10-CM

## 2019-05-27 NOTE — Progress Notes (Addendum)
MEDICARE ANNUAL WELLNESS VISIT  05/27/2019  Telephone Visit Disclaimer This Medicare AWV was conducted by telephone due to national recommendations for restrictions regarding the COVID-19 Pandemic (e.g. social distancing).  I verified, using two identifiers, that I am speaking with Lindsey Phillips or their authorized healthcare agent. I discussed the limitations, risks, security, and privacy concerns of performing an evaluation and management service by telephone and the potential availability of an in-person appointment in the future. The patient expressed understanding and agreed to proceed.   Subjective:  Lindsey Phillips is a 68 y.o. female patient of Bennie Pierini, FNP who had a Medicare Annual Wellness Visit today via telephone. Lindsey Phillips is Retired and lives alone. she has 1 child. she reports that she is socially active and does interact with friends/family regularly. she is minimally physically active and enjoys reading, playing games on her phone, visiting with family, walking and yard work.  Patient Care Team: Bennie Pierini, FNP as PCP - General (Nurse Practitioner) Vickki Hearing, MD as Consulting Physician (Orthopedic Surgery)  Advanced Directives 05/27/2019 05/14/2018 02/28/2017 12/27/2016 07/03/2016 07/02/2016 07/02/2016  Does Patient Have a Medical Advance Directive? No No No No No No No  Would patient like information on creating a medical advance directive? No - Patient declined No - Patient declined - Yes (ED - Information included in AVS) - No - Patient declined No - Patient declined    Hospital Utilization Over the Past 12 Months: # of hospitalizations or ER visits: 0 # of surgeries: 0  Review of Systems    Patient reports that her overall health is unchanged compared to last year.  History obtained from chart review  Patient Reported Readings (BP, Pulse, CBG, Weight, etc) none  Pain Assessment Pain : No/denies pain     Current Medications &  Allergies (verified) Allergies as of 05/27/2019   No Known Allergies     Medication List       Accurate as of May 27, 2019 10:51 AM. If you have any questions, ask your nurse or doctor.        STOP taking these medications   diclofenac 75 MG EC tablet Commonly known as: VOLTAREN     TAKE these medications   atorvastatin 40 MG tablet Commonly known as: Lipitor Take 1 tablet (40 mg total) by mouth daily.   calcium carbonate 1250 (500 Ca) MG chewable tablet Commonly known as: OS-CAL Chew 1 tablet by mouth daily.   Vitamin D3 25 MCG (1000 UT) Caps Take 1 capsule by mouth daily.       History (reviewed): Past Medical History:  Diagnosis Date  . Arthritis   . Hyperlipidemia   . Vitamin D deficiency    Past Surgical History:  Procedure Laterality Date  . ABDOMINAL HYSTERECTOMY    . KNEE ARTHROSCOPY Right 07/03/2016   Procedure: RIGHT KNEE ARTHROSCOPY;  Surgeon: Vickki Hearing, MD;  Location: [facility];  Service: Orthopedics;  Laterality: Right;   Family History  Problem Relation Age of Onset  . Hypertension Mother   . Diabetes Mother   . Heart disease Mother   . Kidney disease Mother   . Diabetes Father   . Hypertension Sister   . Diabetes Brother   . Hypertension Brother   . Hypertension Sister   . Diabetes Brother   . Hypertension Brother   . Diabetes Brother   . Hypertension Brother   . Diabetes Brother   . Kidney disease Brother   . Colon cancer  Neg Hx    Social History   Socioeconomic History  . Marital status: Legally Separated    Spouse name: Not on file  . Number of children: 1  . Years of education: 17  . Highest education level: 11th grade  Occupational History  . Occupation: retired  Engineer, production  . Financial resource strain: Not hard at all  . Food insecurity    Worry: Never true    Inability: Never true  . Transportation needs    Medical: No    Non-medical: No  Tobacco Use  . Smoking status: Former Smoker    Packs/day:  0.25    Years: 15.00    Pack years: 3.75    Types: Cigarettes    Quit date: 07/17/2013    Years since quitting: 5.8  . Smokeless tobacco: Never Used  Substance and Sexual Activity  . Alcohol use: No  . Drug use: No  . Sexual activity: Not Currently    Birth control/protection: Surgical  Lifestyle  . Physical activity    Days per week: 2 days    Minutes per session: 20 min  . Stress: Not at all  Relationships  . Social connections    Talks on phone: More than three times a week    Gets together: More than three times a week    Attends religious service: More than 4 times per year    Active member of club or organization: Yes    Attends meetings of clubs or organizations: More than 4 times per year    Relationship status: Separated  Other Topics Concern  . Not on file  Social History Narrative  . Not on file    Activities of Daily Living In your present state of health, do you have any difficulty performing the following activities: 05/27/2019  Hearing? N  Vision? N  Comment wears rx glasses all the time-gets yearly eye exam  Difficulty concentrating or making decisions? N  Walking or climbing stairs? N  Dressing or bathing? N  Doing errands, shopping? N  Preparing Food and eating ? N  Using the Toilet? N  In the past six months, have you accidently leaked urine? N  Do you have problems with loss of bowel control? N  Managing your Medications? N  Managing your Finances? N  Housekeeping or managing your Housekeeping? N  Some recent data might be hidden    Patient Education/ Literacy How often do you need to have someone help you when you read instructions, pamphlets, or other written materials from your doctor or pharmacy?: 1 - Never What is the last grade level you completed in school?: 11th grade  Exercise Current Exercise Habits: Home exercise routine, Type of exercise: walking, Time (Minutes): 20, Frequency (Times/Week): 2, Weekly Exercise (Minutes/Week): 40,  Intensity: Mild, Exercise limited by: None identified  Diet Patient reports consuming 2 meals a day and 2 snack(s) a day Patient reports that her primary diet is: Regular Patient reports that she does have regular access to food.   Depression Screen PHQ 2/9 Scores 05/27/2019 09/23/2018 05/14/2018 04/16/2018 04/09/2018 03/27/2018 09/24/2017  PHQ - 2 Score 0 0 0 0 0 0 0     Fall Risk Fall Risk  05/27/2019 09/23/2018 05/14/2018 04/16/2018 04/09/2018  Falls in the past year? 0 0 No No No     Objective:  Lindsey Phillips seemed alert and oriented and she participated appropriately during our telephone visit.  Blood Pressure Weight BMI  BP Readings from Last 3  Encounters:  09/23/18 128/80  05/14/18 123/79  04/16/18 121/77   Wt Readings from Last 3 Encounters:  09/23/18 209 lb (94.8 kg)  05/14/18 211 lb (95.7 kg)  04/16/18 212 lb (96.2 kg)   BMI Readings from Last 1 Encounters:  09/23/18 31.78 kg/m    *Unable to obtain current vital signs, weight, and BMI due to telephone visit type  Hearing/Vision  . Lindsey Phillips did not seem to have difficulty with hearing/understanding during the telephone conversation . Reports that she has had a formal eye exam by an eye care professional within the past year . Reports that she has not had a formal hearing evaluation within the past year *Unable to fully assess hearing and vision during telephone visit type  Cognitive Function: 6CIT Screen 05/27/2019  What Year? 0 points  What month? 0 points  What time? 0 points  Count back from 20 0 points  Months in reverse 0 points  Repeat phrase 4 points  Total Score 4   (Normal:0-7, Significant for Dysfunction: >8)  Normal Cognitive Function Screening: Yes   Immunization & Health Maintenance Record Immunization History  Administered Date(s) Administered  . Influenza, High Dose Seasonal PF 05/07/2017  . Influenza, Quadrivalent, Recombinant, Inj, Pf 04/21/2019  . Influenza,inj,Quad PF,6+ Mos 04/16/2018  .  Influenza-Unspecified 04/16/2016  . Pneumococcal Conjugate-13 11/18/2015  . Pneumococcal Polysaccharide-23 12/27/2016    Health Maintenance  Topic Date Due  . TETANUS/TDAP  09/23/2019 (Originally 08/[birth year]/1969)  . MAMMOGRAM  05/14/2020  . COLONOSCOPY  03/15/2027  . INFLUENZA VACCINE  Completed  . DEXA SCAN  Completed  . Hepatitis C Screening  Completed  . PNA vac Low Risk Adult  Completed       Assessment  This is a routine wellness examination for Lindsey PedWanda G Phillips.  Health Maintenance: Due or Overdue There are no preventive care reminders to display for this patient.  Lindsey Phillips does not need a referral for Community Assistance: Care Management:   no Social Work:    no Prescription Assistance:  no Nutrition/Diabetes Education:  no   Plan:  Personalized Goals Goals Addressed            This Visit's Progress   . DIET - INCREASE WATER INTAKE       Try to drink 6-8 glasses of water daily      Personalized Health Maintenance & Screening Recommendations  Td vaccine Shingles vaccine  Lung Cancer Screening Recommended: no (Low Dose CT Chest recommended if Age 3-80 years, 30 pack-year currently smoking OR have quit w/in past 15 years) Hepatitis C Screening recommended: no HIV Screening recommended: no  Advanced Directives: Written information was not prepared per patient's request.  Referrals & Orders No orders of the defined types were placed in this encounter.   Follow-up Plan . Follow-up with Bennie PieriniMartin, Mary-Margaret, FNP as planned . Consider Shingles and TDAP vaccines at your next visit with your PCP   I have personally reviewed and noted the following in the patient's chart:   . Medical and social history . Use of alcohol, tobacco or illicit drugs  . Current medications and supplements . Functional ability and status . Nutritional status . Physical activity . Advanced directives . List of other physicians . Hospitalizations, surgeries, and ER visits in  previous 12 months . Vitals . Screenings to include cognitive, depression, and falls . Referrals and appointments  In addition, I have reviewed and discussed with Lindsey Phillips certain preventive protocols, quality metrics, and best practice recommendations. A  written personalized care plan for preventive services as well as general preventive health recommendations is available and can be mailed to the patient at her request.      , Donny Pique, LPN  38/87/1959   I have reviewed and agree with the above AWV documentation.   Mary-Margaret Hassell Done, FNP

## 2019-05-27 NOTE — Patient Instructions (Signed)
Preventive Care 68 Years and Older, Female Preventive care refers to lifestyle choices and visits with your health care provider that can promote health and wellness. This includes:  A yearly physical exam. This is also called an annual well check.  Regular dental and eye exams.  Immunizations.  Screening for certain conditions.  Healthy lifestyle choices, such as diet and exercise. What can I expect for my preventive care visit? Physical exam Your health care provider will check:  Height and weight. These may be used to calculate body mass index (BMI), which is a measurement that tells if you are at a healthy weight.  Heart rate and blood pressure.  Your skin for abnormal spots. Counseling Your health care provider may ask you questions about:  Alcohol, tobacco, and drug use.  Emotional well-being.  Home and relationship well-being.  Sexual activity.  Eating habits.  History of falls.  Memory and ability to understand (cognition).  Work and work Statistician.  Pregnancy and menstrual history. What immunizations do I need?  Influenza (flu) vaccine  This is recommended every year. Tetanus, diphtheria, and pertussis (Tdap) vaccine  You may need a Td booster every 10 years. Varicella (chickenpox) vaccine  You may need this vaccine if you have not already been vaccinated. Zoster (shingles) vaccine  You may need this after age 68. Pneumococcal conjugate (PCV13) vaccine  One dose is recommended after age 68. Pneumococcal polysaccharide (PPSV23) vaccine  One dose is recommended after age 72. Measles, mumps, and rubella (MMR) vaccine  You may need at least one dose of MMR if you were born in 1957 or later. You may also need a second dose. Meningococcal conjugate (MenACWY) vaccine  You may need this if you have certain conditions. Hepatitis A vaccine  You may need this if you have certain conditions or if you travel or work in places where you may be exposed  to hepatitis A. Hepatitis B vaccine  You may need this if you have certain conditions or if you travel or work in places where you may be exposed to hepatitis B. Haemophilus influenzae type b (Hib) vaccine  You may need this if you have certain conditions. You may receive vaccines as individual doses or as more than one vaccine together in one shot (combination vaccines). Talk with your health care provider about the risks and benefits of combination vaccines. What tests do I need? Blood tests  Lipid and cholesterol levels. These may be checked every 5 years, or more frequently depending on your overall health.  Hepatitis C test.  Hepatitis B test. Screening  Lung cancer screening. You may have this screening every year starting at age 68 if you have a 30-pack-year history of smoking and currently smoke or have quit within the past 15 years.  Colorectal cancer screening. All adults should have this screening starting at age 68 and continuing until age 15. Your health care provider may recommend screening at age 68 if you are at increased risk. You will have tests every 1-10 years, depending on your results and the type of screening test.  Diabetes screening. This is done by checking your blood sugar (glucose) after you have not eaten for a while (fasting). You may have this done every 1-3 years.  Mammogram. This may be done every 1-2 years. Talk with your health care provider about how often you should have regular mammograms.  BRCA-related cancer screening. This may be done if you have a family history of breast, ovarian, tubal, or peritoneal cancers.  Other tests  Sexually transmitted disease (STD) testing.  Bone density scan. This is done to screen for osteoporosis. You may have this done starting at age 68. Follow these instructions at home: Eating and drinking  Eat a diet that includes fresh fruits and vegetables, whole grains, lean protein, and low-fat dairy products. Limit  your intake of foods with high amounts of sugar, saturated fats, and salt.  Take vitamin and mineral supplements as recommended by your health care provider.  Do not drink alcohol if your health care provider tells you not to drink.  If you drink alcohol: ? Limit how much you have to 0-1 drink a day. ? Be aware of how much alcohol is in your drink. In the [place] one drink equals one 12 oz bottle of beer (355 mL), one 5 oz glass of wine (148 mL), or one 1 oz glass of hard liquor (44 mL). Lifestyle  Take daily care of your teeth and gums.  Stay active. Exercise for at least 30 minutes on 5 or more days each week.  Do not use any products that contain nicotine or tobacco, such as cigarettes, e-cigarettes, and chewing tobacco. If you need help quitting, ask your health care provider.  If you are sexually active, practice safe sex. Use a condom or other form of protection in order to prevent STIs (sexually transmitted infections).  Talk with your health care provider about taking a low-dose aspirin or statin. What's next?  Go to your health care provider once a year for a well check visit.  Ask your health care provider how often you should have your eyes and teeth checked.  Stay up to date on all vaccines. This information is not intended to replace advice given to you by your health care provider. Make sure you discuss any questions you have with your health care provider. Document Released: 07/30/2015 Document Revised: 06/27/2018 Document Reviewed: 06/27/2018 Elsevier Patient Education  2020 [employer].

## 2019-06-25 ENCOUNTER — Other Ambulatory Visit: Payer: Self-pay

## 2019-06-26 ENCOUNTER — Ambulatory Visit (INDEPENDENT_AMBULATORY_CARE_PROVIDER_SITE_OTHER): Payer: Medicare Other | Admitting: Nurse Practitioner

## 2019-06-26 ENCOUNTER — Encounter: Payer: Self-pay | Admitting: Nurse Practitioner

## 2019-06-26 VITALS — BP 135/79 | HR 93 | Temp 97.0°F | Resp 20 | Ht 68.0 in | Wt 210.0 lb

## 2019-06-26 DIAGNOSIS — Z6828 Body mass index (BMI) 28.0-28.9, adult: Secondary | ICD-10-CM

## 2019-06-26 DIAGNOSIS — E785 Hyperlipidemia, unspecified: Secondary | ICD-10-CM

## 2019-06-26 DIAGNOSIS — E559 Vitamin D deficiency, unspecified: Secondary | ICD-10-CM

## 2019-06-26 MED ORDER — ATORVASTATIN CALCIUM 40 MG PO TABS: 40.0000 mg | ORAL_TABLET | Freq: Every day | ORAL | 1 refills | Status: DC

## 2019-06-26 NOTE — Patient Instructions (Signed)

## 2019-06-26 NOTE — Progress Notes (Signed)
Subjective:    Patient ID: Lindsey Phillips, female    DOB: Aug 07, 1950, 68 y.o.   MRN: [id number]   Chief Complaint: Medical Management of Chronic Issues    HPI:  1. Hyperlipidemia with target LDL less than 100 She tries not to eat a lot of fried foods. Otherwise eats whatever. Takes lipitor nightly Lab Results  Component Value Date   CHOL 163 09/23/2018   HDL 30 (L) 09/23/2018   LDLCALC 111 (H) 09/23/2018   TRIG 111 09/23/2018   CHOLHDL 5.4 (H) 09/23/2018     2. Vitamin D deficiency taes a daily vitamin d suplement  3. BMI 28.0-28.9,adult No recent weight changes Wt Readings from Last 3 Encounters:  06/26/19 210 lb (95.3 kg)  09/23/18 209 lb (94.8 kg)  05/14/18 211 lb (95.7 kg)   BP Readings from Last 3 Encounters:  06/26/19 135/79  09/23/18 128/80  05/14/18 123/79       Outpatient Encounter Medications as of 06/26/2019  Medication Sig  . atorvastatin (LIPITOR) 40 MG tablet Take 1 tablet (40 mg total) by mouth daily.  . calcium carbonate (OS-CAL) 1250 (500 Ca) MG chewable tablet Chew 1 tablet by mouth daily.  . Cholecalciferol (VITAMIN D3) 1000 units CAPS Take 1 capsule by mouth daily.   Facility-Administered Encounter Medications as of 06/26/2019  Medication  . 0.9 %  sodium chloride infusion    Past Surgical History:  Procedure Laterality Date  . ABDOMINAL HYSTERECTOMY    . KNEE ARTHROSCOPY Right 07/03/2016   Procedure: RIGHT KNEE ARTHROSCOPY;  Surgeon: Carole Civil, MD;  Location: [facility];  Service: Orthopedics;  Laterality: Right;    Family History  Problem Relation Age of Onset  . Hypertension Mother   . Diabetes Mother   . Heart disease Mother   . Kidney disease Mother   . Diabetes Father   . Hypertension Sister   . Diabetes Brother   . Hypertension Brother   . Hypertension Sister   . Diabetes Brother   . Hypertension Brother   . Diabetes Brother   . Hypertension Brother   . Diabetes Brother   . Kidney disease Brother   . Colon  cancer Neg Hx     New complaints: None today  Slives by herself. She has family that checks on herdalyocial history:  Controlled substance contract: n/a    Review of Systems  Constitutional: Negative for diaphoresis.  Eyes: Negative for pain.  Respiratory: Negative for shortness of breath.   Cardiovascular: Negative for chest pain, palpitations and leg swelling.  Gastrointestinal: Negative for abdominal pain.  Endocrine: Negative for polydipsia.  Skin: Negative for rash.  Neurological: Negative for dizziness, weakness and headaches.  Hematological: Does not bruise/bleed easily.  All other systems reviewed and are negative.      Objective:   Physical Exam Vitals and nursing note reviewed.  Constitutional:      General: She is not in acute distress.    Appearance: Normal appearance. She is well-developed.  HENT:     Head: Normocephalic.     Nose: Nose normal.  Eyes:     Pupils: Pupils are equal, round, and reactive to light.  Neck:     Vascular: No carotid bruit or JVD.  Cardiovascular:     Rate and Rhythm: Normal rate and regular rhythm.     Heart sounds: Normal heart sounds.  Pulmonary:     Effort: Pulmonary effort is normal. No respiratory distress.     Breath sounds: Normal breath sounds.  No wheezing or rales.  Chest:     Chest wall: No tenderness.  Abdominal:     General: Bowel sounds are normal. There is no distension or abdominal bruit.     Palpations: Abdomen is soft. There is no hepatomegaly, splenomegaly, mass or pulsatile mass.     Tenderness: There is no abdominal tenderness.  Musculoskeletal:        General: Normal range of motion.     Cervical back: Normal range of motion and neck supple.  Lymphadenopathy:     Cervical: No cervical adenopathy.  Skin:    General: Skin is warm and dry.  Neurological:     Mental Status: She is alert and oriented to person, place, and time.     Deep Tendon Reflexes: Reflexes are normal and symmetric.  Psychiatric:         Behavior: Behavior normal.        Thought Content: Thought content normal.        Judgment: Judgment normal.    BP 135/79   Pulse 93   Temp (!) 97 F (36.1 C) (Temporal)   Resp 20   Ht _0  (1.727 m)   Wt 210 lb (95.3 kg)   SpO2 97%   BMI 31.93 kg/m         Assessment & Plan:  Lindsey Phillips comes in today with chief complaint of Medical Management of Chronic Issues   Diagnosis and orders addressed:  1. Hyperlipidemia with target LDL less than 100 Low fat diet - atorvastatin (LIPITOR) 40 MG tablet; Take 1 tablet (40 mg total) by mouth daily.  Dispense: 90 tablet; Refill: 1 - CMP14+EGFR - Lipid panel  2. Vitamin D deficiency Continue daily vitamin d supplement  3. BMI 28.0-28.9,adult Discussed diet and exercise for person with BMI >25 Will recheck weight in 3-6 months   Labs pending Health Maintenance reviewed Diet and exercise encouraged  Follow up plan: 6 months   Mary-Margaret Hassell Done, FNP

## 2019-06-27 LAB — CMP14+EGFR
ALT: 14 IU/L (ref 0–32)
AST: 18 IU/L (ref 0–40)
Albumin/Globulin Ratio: 1.3 (ref 1.2–2.2)
Albumin: 4.3 g/dL (ref 3.8–4.8)
Alkaline Phosphatase: 144 IU/L — ABNORMAL HIGH (ref 39–117)
BUN/Creatinine Ratio: 15 (ref 12–28)
BUN: 16 mg/dL (ref 8–27)
Bilirubin Total: 0.3 mg/dL (ref 0.0–1.2)
CO2: 27 mmol/L (ref 20–29)
Calcium: 10.1 mg/dL (ref 8.7–10.3)
Chloride: 106 mmol/L (ref 96–106)
Creatinine, Ser: 1.08 mg/dL — ABNORMAL HIGH (ref 0.57–1.00)
GFR calc Af Amer: 61 mL/min/{1.73_m2} (ref >59)
GFR calc non Af Amer: 53 mL/min/{1.73_m2} — ABNORMAL LOW (ref >59)
Globulin, Total: 3.2 g/dL (ref 1.5–4.5)
Glucose: 99 mg/dL (ref 65–99)
Potassium: 4.4 mmol/L (ref 3.5–5.2)
Sodium: 143 mmol/L (ref 134–144)
Total Protein: 7.5 g/dL (ref 6.0–8.5)

## 2019-06-27 LAB — LIPID PANEL
Chol/HDL Ratio: 5 ratio — ABNORMAL HIGH (ref 0.0–4.4)
Cholesterol, Total: 159 mg/dL (ref 100–199)
HDL: 32 mg/dL — ABNORMAL LOW (ref >39)
LDL Chol Calc (NIH): 107 mg/dL — ABNORMAL HIGH (ref 0–99)
Triglycerides: 109 mg/dL (ref 0–149)
VLDL Cholesterol Cal: 20 mg/dL (ref 5–40)

## 2019-12-25 ENCOUNTER — Ambulatory Visit (INDEPENDENT_AMBULATORY_CARE_PROVIDER_SITE_OTHER): Payer: Medicare Other | Admitting: Nurse Practitioner

## 2019-12-25 ENCOUNTER — Other Ambulatory Visit: Payer: Self-pay

## 2019-12-25 ENCOUNTER — Encounter: Payer: Self-pay | Admitting: Nurse Practitioner

## 2019-12-25 VITALS — BP 131/82 | HR 88 | Temp 97.5°F | Resp 20 | Ht 68.0 in | Wt 214.0 lb

## 2019-12-25 DIAGNOSIS — E559 Vitamin D deficiency, unspecified: Secondary | ICD-10-CM | POA: Diagnosis not present

## 2019-12-25 DIAGNOSIS — E785 Hyperlipidemia, unspecified: Secondary | ICD-10-CM

## 2019-12-25 DIAGNOSIS — Z6833 Body mass index (BMI) 33.0-33.9, adult: Secondary | ICD-10-CM | POA: Diagnosis not present

## 2019-12-25 MED ORDER — ATORVASTATIN CALCIUM 40 MG PO TABS: 40.0000 mg | ORAL_TABLET | Freq: Every day | ORAL | 1 refills | Status: DC

## 2019-12-25 NOTE — Patient Instructions (Signed)
Exercising to Stay Healthy To become healthy and stay healthy, it is recommended that you do moderate-intensity and vigorous-intensity exercise. You can tell that you are exercising at a moderate intensity if your heart starts beating faster and you start breathing faster but can still hold a conversation. You can tell that you are exercising at a vigorous intensity if you are breathing much harder and faster and cannot hold a conversation while exercising. Exercising regularly is important. It has many health benefits, such as:  Improving overall fitness, flexibility, and endurance.  Increasing bone density.  Helping with weight control.  Decreasing body fat.  Increasing muscle strength.  Reducing stress and tension.  Improving overall health. How often should I exercise? Choose an activity that you enjoy, and set realistic goals. Your health care provider can help you make an activity plan that works for you. Exercise regularly as told by your health care provider. This may include:  Doing strength training two times a week, such as: ? Lifting weights. ? Using resistance bands. ? Push-ups. ? Sit-ups. ? Yoga.  Doing a certain intensity of exercise for a given amount of time. Choose from these options: ? A total of 150 minutes of moderate-intensity exercise every week. ? A total of 75 minutes of vigorous-intensity exercise every week. ? A mix of moderate-intensity and vigorous-intensity exercise every week. Children, pregnant women, people who have not exercised regularly, people who are overweight, and older adults may need to talk with a health care provider about what activities are safe to do. If you have a medical condition, be sure to talk with your health care provider before you start a new exercise program. What are some exercise ideas? Moderate-intensity exercise ideas include:  Walking 1 mile (1.6 km) in about 15  minutes.  Biking.  Hiking.  Golfing.  Dancing.  Water aerobics. Vigorous-intensity exercise ideas include:  Walking 4.5 miles (7.2 km) or more in about 1 hour.  Jogging or running 5 miles (8 km) in about 1 hour.  Biking 10 miles (16.1 km) or more in about 1 hour.  Lap swimming.  Roller-skating or in-line skating.  Cross-country skiing.  Vigorous competitive sports, such as football, basketball, and soccer.  Jumping rope.  Aerobic dancing. What are some everyday activities that can help me to get exercise?  Yard work, such as: ? Pushing a lawn mower. ? Raking and bagging leaves.  Washing your car.  Pushing a stroller.  Shoveling snow.  Gardening.  Washing windows or floors. How can I be more active in my day-to-day activities?  Use stairs instead of an elevator.  Take a walk during your lunch break.  If you drive, park your car farther away from your work or school.  If you take public transportation, get off one stop early and walk the rest of the way.  Stand up or walk around during all of your indoor phone calls.  Get up, stretch, and walk around every 30 minutes throughout the day.  Enjoy exercise with a friend. Support to continue exercising will help you keep a regular routine of activity. What guidelines can I follow while exercising?  Before you start a new exercise program, talk with your health care provider.  Do not exercise so much that you hurt yourself, feel dizzy, or get very short of breath.  Wear comfortable clothes and wear shoes with good support.  Drink plenty of water while you exercise to prevent dehydration or heat stroke.  Work out until your breathing   and your heartbeat get faster. Where to find more information  [place] Department of Health and Human Services: www.hhs.gov  Centers for Disease Control and Prevention (CDC): www.cdc.gov Summary  Exercising regularly is important. It will improve your overall fitness,  flexibility, and endurance.  Regular exercise also will improve your overall health. It can help you control your weight, reduce stress, and improve your bone density.  Do not exercise so much that you hurt yourself, feel dizzy, or get very short of breath.  Before you start a new exercise program, talk with your health care provider. This information is not intended to replace advice given to you by your health care provider. Make sure you discuss any questions you have with your health care provider. Document Revised: 06/15/2017 Document Reviewed: 05/24/2017 Elsevier Patient Education  2020 [employer]  

## 2019-12-25 NOTE — Progress Notes (Signed)
° °Subjective:  ° ° Patient ID: Lindsey Phillips, female    DOB: 07/26/1950, 69 y.o.   MRN: [id number] ° ° °Chief Complaint: Medical Management of Chronic Issues °  ° °HPI: ° °1. Hyperlipidemia with target LDL less than 100 °Trying to watch diet. Does no dedicated exrecise ° °2. Vitamin D deficiency °Takes daily vitamin d and calcium supplement ° °3. BMI 31.0-31.9,adult °Weight is up 4lbs from last visit °Wt Readings from Last 3 Encounters:  °12/25/19 214 lb (97.1 kg)  °06/26/19 210 lb (95.3 kg)  °09/23/18 209 lb (94.8 kg)  ° ° °BMI Readings from Last 3 Encounters:  °12/25/19 32.54 kg/m²  °06/26/19 31.93 kg/m²  °09/23/18 31.78 kg/m²  ° ° ° ° °Outpatient Encounter Medications as of 12/25/2019  °Medication Sig  °• atorvastatin (LIPITOR) 40 MG tablet Take 1 tablet (40 mg total) by mouth daily.  °• calcium carbonate (OS-CAL) 1250 (500 Ca) MG chewable tablet Chew 1 tablet by mouth daily.  °• Cholecalciferol (VITAMIN D3) 1000 units CAPS Take 1 capsule by mouth daily.  ° °Facility-Administered Encounter Medications as of 12/25/2019  °Medication  °• 0.9 %  sodium chloride infusion  ° ° °Past Surgical History:  °Procedure Laterality Date  °• ABDOMINAL HYSTERECTOMY    °• KNEE ARTHROSCOPY Right 07/03/2016  ° Procedure: RIGHT KNEE ARTHROSCOPY;  Surgeon: Lindsey E Harrison, MD;  Location: [facility];  Service: Orthopedics;  Laterality: Right;  ° ° °Family History  °Problem Relation Age of Onset  °• Hypertension Mother   °• Diabetes Mother   °• Heart disease Mother   °• Kidney disease Mother   °• Diabetes Father   °• Hypertension Sister   °• Diabetes Brother   °• Hypertension Brother   °• Hypertension Sister   °• Diabetes Brother   °• Hypertension Brother   °• Diabetes Brother   °• Hypertension Brother   °• Diabetes Brother   °• Kidney disease Brother   °• Colon cancer Neg Hx   ° ° °New complaints: °None today ° °Social history: °Lives by herself. Has family that checks on her daily ° °Controlled substance contract: n/a ° ° ° ° °Review of  Systems  °Constitutional: Negative for diaphoresis.  °Eyes: Negative for pain.  °Respiratory: Negative for shortness of breath.   °Cardiovascular: Negative for chest pain, palpitations and leg swelling.  °Gastrointestinal: Negative for abdominal pain.  °Endocrine: Negative for polydipsia.  °Skin: Negative for rash.  °Neurological: Negative for dizziness, weakness and headaches.  °Hematological: Does not bruise/bleed easily.  °All other systems reviewed and are negative. ° ° °   °Objective:  ° Physical Exam °Vitals and nursing note reviewed.  °Constitutional:   °   General: She is not in acute distress. °   Appearance: Normal appearance. She is well-developed.  °HENT:  °   Head: Normocephalic.  °   Nose: Nose normal.  °Eyes:  °   Pupils: Pupils are equal, round, and reactive to light.  °Neck:  °   Vascular: No carotid bruit or JVD.  °Cardiovascular:  °   Rate and Rhythm: Normal rate and regular rhythm.  °   Heart sounds: Normal heart sounds.  °Pulmonary:  °   Effort: Pulmonary effort is normal. No respiratory distress.  °   Breath sounds: Normal breath sounds. No wheezing or rales.  °Chest:  °   Chest wall: No tenderness.  °Abdominal:  °   General: Bowel sounds are normal. There is no distension or abdominal bruit.  °     Palpations: Abdomen is soft. There is no hepatomegaly, splenomegaly, mass or pulsatile mass.     Tenderness: There is no abdominal tenderness.  Musculoskeletal:        General: Normal range of motion.     Cervical back: Normal range of motion and neck supple.  Lymphadenopathy:     Cervical: No cervical adenopathy.  Skin:    General: Skin is warm and dry.  Neurological:     Mental Status: She is alert and oriented to person, place, and time.     Deep Tendon Reflexes: Reflexes are normal and symmetric.  Psychiatric:        Behavior: Behavior normal.        Thought Content: Thought content normal.        Judgment: Judgment normal.    BP 131/82    Pulse 88    Temp (!) 97.5 F (36.4 C)  (Temporal)    Resp 20    Ht 5' 8" (1.727 m)    Wt 214 lb (97.1 kg)    SpO2 97%    BMI 32.54 kg/m        Assessment & Plan:  Lindsey Phillips comes in today with chief complaint of Medical Management of Chronic Issues   Diagnosis and orders addressed:  1. Hyperlipidemia with target LDL less than 100 Low fat diet - CBC with Differential/Platelet - CMP14+EGFR - Lipid panel - atorvastatin (LIPITOR) 40 MG tablet; Take 1 tablet (40 mg total) by mouth daily.  Dispense: 90 tablet; Refill: 1  2. Vitamin D deficiency Continue daily vitamin d and calcium supplement  3. BMI 33.0-33.9,adult Discussed diet and exercise for person with BMI >25 Will recheck weight in 3-6 months    Labs pending Health Maintenance reviewed Diet and exercise encouraged  Follow up plan: 6 months   Lindsey Hassell Done, FNP

## 2019-12-26 LAB — CBC WITH DIFFERENTIAL/PLATELET
Basophils Absolute: 0 10*3/uL (ref 0.0–0.2)
Basos: 0 %
EOS (ABSOLUTE): 0.1 10*3/uL (ref 0.0–0.4)
Eos: 2 %
Hematocrit: 36.3 % (ref 34.0–46.6)
Hemoglobin: 12 g/dL (ref 11.1–15.9)
Immature Grans (Abs): 0 10*3/uL (ref 0.0–0.1)
Immature Granulocytes: 0 %
Lymphocytes Absolute: 2.6 10*3/uL (ref 0.7–3.1)
Lymphs: 42 %
MCH: 27.3 pg (ref 26.6–33.0)
MCHC: 33.1 g/dL (ref 31.5–35.7)
MCV: 83 fL (ref 79–97)
Monocytes Absolute: 0.4 10*3/uL (ref 0.1–0.9)
Monocytes: 6 %
Neutrophils Absolute: 3.2 10*3/uL (ref 1.4–7.0)
Neutrophils: 50 %
Platelets: 268 10*3/uL (ref 150–450)
RBC: 4.4 x10E6/uL (ref 3.77–5.28)
RDW: 14.2 % (ref 11.7–15.4)
WBC: 6.4 10*3/uL (ref 3.4–10.8)

## 2019-12-26 LAB — CMP14+EGFR
ALT: 13 IU/L (ref 0–32)
AST: 16 IU/L (ref 0–40)
Albumin/Globulin Ratio: 1.3 (ref 1.2–2.2)
Albumin: 4 g/dL (ref 3.8–4.8)
Alkaline Phosphatase: 146 IU/L — ABNORMAL HIGH (ref 48–121)
BUN/Creatinine Ratio: 17 (ref 12–28)
BUN: 15 mg/dL (ref 8–27)
Bilirubin Total: 0.3 mg/dL (ref 0.0–1.2)
CO2: 23 mmol/L (ref 20–29)
Calcium: 9.4 mg/dL (ref 8.7–10.3)
Chloride: 106 mmol/L (ref 96–106)
Creatinine, Ser: 0.88 mg/dL (ref 0.57–1.00)
GFR calc Af Amer: 78 mL/min/{1.73_m2} (ref >59)
GFR calc non Af Amer: 67 mL/min/{1.73_m2} (ref >59)
Globulin, Total: 3.1 g/dL (ref 1.5–4.5)
Glucose: 153 mg/dL — ABNORMAL HIGH (ref 65–99)
Potassium: 4 mmol/L (ref 3.5–5.2)
Sodium: 143 mmol/L (ref 134–144)
Total Protein: 7.1 g/dL (ref 6.0–8.5)

## 2019-12-26 LAB — LIPID PANEL
Chol/HDL Ratio: 5 ratio — ABNORMAL HIGH (ref 0.0–4.4)
Cholesterol, Total: 154 mg/dL (ref 100–199)
HDL: 31 mg/dL — ABNORMAL LOW (ref >39)
LDL Chol Calc (NIH): 100 mg/dL — ABNORMAL HIGH (ref 0–99)
Triglycerides: 128 mg/dL (ref 0–149)
VLDL Cholesterol Cal: 23 mg/dL (ref 5–40)

## 2020-05-27 ENCOUNTER — Ambulatory Visit (INDEPENDENT_AMBULATORY_CARE_PROVIDER_SITE_OTHER): Payer: Medicare Other | Admitting: *Deleted

## 2020-05-27 VITALS — Ht 68.0 in | Wt 214.0 lb

## 2020-05-27 DIAGNOSIS — Z0000 Encounter for general adult medical examination without abnormal findings: Secondary | ICD-10-CM | POA: Diagnosis not present

## 2020-05-27 NOTE — Patient Instructions (Addendum)
  MEDICARE ANNUAL WELLNESS VISIT Health Maintenance Summary and Written Plan of Care  Ms. Lorenzo ,  Thank you for allowing me to perform your Medicare Annual Wellness Visit and for your ongoing commitment to your health.   Health Maintenance & Immunization History Health Maintenance  Topic Date Due  . TETANUS/TDAP  Never done  . MAMMOGRAM  05/26/2021  . COLONOSCOPY  03/15/2027  . INFLUENZA VACCINE  Completed  . DEXA SCAN  Completed  . COVID-19 Vaccine  Completed  . Hepatitis C Screening  Completed  . PNA vac Low Risk Adult  Completed   Immunization History  Administered Date(s) Administered  . Influenza, High Dose Seasonal PF 05/07/2017, 04/25/2020  . Influenza, Quadrivalent, Recombinant, Inj, Pf 04/21/2019  . Influenza,inj,Quad PF,6+ Mos 04/16/2018  . Influenza-Unspecified 04/16/2016  . Moderna SARS-COVID-2 Vaccination 08/26/2019, 09/23/2019  . Pneumococcal Conjugate-13 11/18/2015  . Pneumococcal Polysaccharide-23 12/27/2016    These are the patient goals that we discussed: Goals Addressed            This Visit's Progress   . Exercise 3x per week (30 min per time)       [employer] and include participating in [employer] group or stationary bike/recumbent stepper in current exercise regimen.    05/27/2020 AWV Goal: Exercise for General Health   Patient will verbalize understanding of the benefits of increased physical activity:  Exercising regularly is important. It will improve your overall fitness, flexibility, and endurance.  Regular exercise also will improve your overall health. It can help you control your weight, reduce stress, and improve your bone density.  Over the next year, patient will increase physical activity as tolerated with a goal of at least 150 minutes of moderate physical activity per week.   You can tell that you are exercising at a moderate intensity if your heart starts beating faster and you start breathing faster but can still hold a  conversation.  Moderate-intensity exercise ideas include:  Walking 1 mile (1.6 km) in about 15 minutes  Biking  Hiking  Golfing  Dancing  Water aerobics  Patient will verbalize understanding of everyday activities that increase physical activity by providing examples like the following: ? Yard work, such as: ? Pushing a Surveyor, mining ? Raking and bagging leaves ? Washing your car ? Pushing a stroller ? Shoveling snow ? Gardening ? Washing windows or floors  Patient will be able to explain general safety guidelines for exercising:   Before you start a new exercise program, talk with your health care provider.  Do not exercise so much that you hurt yourself, feel dizzy, or get very short of breath.  Wear comfortable clothes and wear shoes with good support.  Drink plenty of water while you exercise to prevent dehydration or heat stroke.  Work out until your breathing and your heartbeat get faster.         This is a list of Health Maintenance Items that are overdue or due now: Health Maintenance Due  Topic Date Due  . TETANUS/TDAP  Never done     Orders/Referrals Placed Today: No orders of the defined types were placed in this encounter.  (Contact our referral department at [phone] if you have not spoken with someone about your referral appointment within the next 5 days)    Follow-up Plan Follow up with Mary-Margaret Daphine Deutscher, FNP as scheduled  Schedule eye exam

## 2020-05-27 NOTE — Progress Notes (Signed)
MEDICARE ANNUAL WELLNESS VISIT  05/27/2020  Telephone Visit Disclaimer This Medicare AWV was conducted by telephone due to national recommendations for restrictions regarding the COVID-19 Pandemic (e.g. social distancing).  I verified, using two identifiers, that I am speaking with Lindsey Phillips or their authorized healthcare agent. I discussed the limitations, risks, security, and privacy concerns of performing an evaluation and management service by telephone and the potential availability of an in-person appointment in the future. The patient expressed understanding and agreed to proceed.  Location of Patient: Home Location of Provider (nurse):  Office  Subjective:    Lindsey Phillips is a 69 y.o. female patient of Bennie Pierini, FNP who had a Medicare Annual Wellness Visit today via telephone. Lindsey Phillips is Retired and lives alone. she has 1 child who lives in [place] . she reports that she is socially active and does interact with friends/family regularly. she is minimally physically active and enjoys gardening.  Patient Care Team: Bennie Pierini, FNP as PCP - General (Nurse Practitioner) Vickki Hearing, MD as Consulting Physician (Orthopedic Surgery)  Advanced Directives 05/27/2020 05/27/2019 05/14/2018 02/28/2017 12/27/2016 07/03/2016 07/02/2016  Does Patient Have a Medical Advance Directive? No No No No No No No  Would patient like information on creating a medical advance directive? No - Patient declined No - Patient declined No - Patient declined - Yes (ED - Information included in AVS) - No - Patient declined    Hospital Utilization Over the Past 12 Months: # of hospitalizations or ER visits: 0 # of surgeries: 0  Review of Systems    Patient reports that her overall health is unchanged compared to last year.  History obtained from chart review and the patient General ROS: negative  Patient Reported Readings (BP, Pulse, CBG, Weight, etc) none  Pain  Assessment Pain : No/denies pain     Current Medications & Allergies (verified) Allergies as of 05/27/2020   No Known Allergies     Medication List       Accurate as of May 27, 2020 11:13 AM. If you have any questions, ask your nurse or doctor.        atorvastatin 40 MG tablet Commonly known as: Lipitor Take 1 tablet (40 mg total) by mouth daily.   calcium carbonate 1250 (500 Ca) MG chewable tablet Commonly known as: OS-CAL Chew 1 tablet by mouth daily.   Vitamin D3 25 MCG (1000 UT) Caps Take 1 capsule by mouth daily.       History (reviewed): Past Medical History:  Diagnosis Date  . Arthritis   . Hyperlipidemia   . Vitamin D deficiency    Past Surgical History:  Procedure Laterality Date  . ABDOMINAL HYSTERECTOMY    . KNEE ARTHROSCOPY Right 07/03/2016   Procedure: RIGHT KNEE ARTHROSCOPY;  Surgeon: Vickki Hearing, MD;  Location: [facility];  Service: Orthopedics;  Laterality: Right;   Family History  Problem Relation Age of Onset  . Hypertension Mother   . Diabetes Mother   . Heart disease Mother   . Kidney disease Mother   . Diabetes Father   . Hypertension Sister   . Diabetes Brother   . Hypertension Brother   . Hypertension Sister   . Diabetes Brother   . Hypertension Brother   . Diabetes Brother   . Hypertension Brother   . Diabetes Brother   . Kidney disease Brother   . Colon cancer Neg Hx    Social History   Socioeconomic History  .  Marital status: Not on file    Spouse name: Not on file  . Number of children: 1  . Years of education: 5511  . Highest education level: 11th grade  Occupational History  . Occupation: retired  Tobacco Use  . Smoking status: Former Smoker    Packs/day: 0.25    Years: 15.00    Pack years: 3.75    Types: Cigarettes    Quit date: 07/17/2013    Years since quitting: 6.8  . Smokeless tobacco: Never Used  Vaping Use  . Vaping Use: Never used  Substance and Sexual Activity  . Alcohol use: No  . Drug  use: No  . Sexual activity: Not Currently    Birth control/protection: Surgical  Other Topics Concern  . Not on file  Social History Narrative  . Not on file   Social Determinants of Health   Financial Resource Strain:   . Difficulty of Paying Living Expenses: Not on file  Food Insecurity:   . Worried About Programme researcher, broadcasting/film/videounning Out of Food in the Last Year: Not on file  . Ran Out of Food in the Last Year: Not on file  Transportation Needs: No Transportation Needs  . Lack of Transportation (Medical): No  . Lack of Transportation (Non-Medical): No  Physical Activity: Insufficiently Active  . Days of Exercise per Week: 2 days  . Minutes of Exercise per Session: 20 min  Stress:   . Feeling of Stress : Not on file  Social Connections: Moderately Integrated  . Frequency of Communication with Friends and Family: More than three times a week  . Frequency of Social Gatherings with Friends and Family: More than three times a week  . Attends Religious Services: More than 4 times per year  . Active Member of Clubs or Organizations: Yes  . Attends BankerClub or Organization Meetings: More than 4 times per year  . Marital Status: Separated    Activities of Daily Living In your present state of health, do you have any difficulty performing the following activities: 05/27/2020  Hearing? N  Vision? N  Difficulty concentrating or making decisions? N  Walking or climbing stairs? N  Dressing or bathing? N  Doing errands, shopping? N  Preparing Food and eating ? N  Using the Toilet? N  In the past six months, have you accidently leaked urine? N  Do you have problems with loss of bowel control? N  Managing your Medications? N  Managing your Finances? N  Housekeeping or managing your Housekeeping? N  Some recent data might be hidden    Patient Education/ Literacy How often do you need to have someone help you when you read instructions, pamphlets, or other written materials from your doctor or pharmacy?: 1  - Never What is the last grade level you completed in school?: 11th Grade  Exercise Current Exercise Habits: Home exercise routine, Type of exercise: walking, Time (Minutes): 20, Frequency (Times/Week): 2, Weekly Exercise (Minutes/Week): 40, Intensity: Mild, Exercise limited by: None identified  Diet Patient reports consuming 3 meals a day and 2 snack(s) a day Patient reports that her primary diet is: Regular Patient reports that she does have regular access to food.   Depression Screen PHQ 2/9 Scores 05/27/2020 05/27/2020 12/25/2019 06/26/2019 05/27/2019 09/23/2018 05/14/2018  PHQ - 2 Score 0 0 0 0 0 0 0     Fall Risk Fall Risk  05/27/2020 12/25/2019 06/26/2019 05/27/2019 09/23/2018  Falls in the past year? 0 0 0 0 0  Number falls in past  yr: 0 - - - -  Injury with Fall? 0 - - - -  Risk for fall due to : No Fall Risks - - - -  Follow up Falls evaluation completed - - - -     Objective:  Lindsey Phillips seemed alert and oriented and she participated appropriately during our telephone visit.  Blood Pressure Weight BMI  BP Readings from Last 3 Encounters:  12/25/19 131/82  06/26/19 135/79  09/23/18 128/80   Wt Readings from Last 3 Encounters:  05/27/20 214 lb (97.1 kg)  12/25/19 214 lb (97.1 kg)  06/26/19 210 lb (95.3 kg)   BMI Readings from Last 1 Encounters:  05/27/20 32.54 kg/m    *Unable to obtain current vital signs, weight, and BMI due to telephone visit type  Hearing/Vision  . Lindsey Phillips did not seem to have difficulty with hearing/understanding during the telephone conversation . Reports that she has not had a formal eye exam by an eye care professional within the past year . Reports that she has not had a formal hearing evaluation within the past year *Unable to fully assess hearing and vision during telephone visit type  Cognitive Function: 6CIT Screen 05/27/2020 05/27/2019  What Year? 0 points 0 points  What month? 0 points 0 points  What time? 0 points 0 points    Count back from 20 0 points 0 points  Months in reverse 0 points 0 points  Repeat phrase 0 points 4 points  Total Score 0 4   (Normal:0-7, Significant for Dysfunction: >8)  Normal Cognitive Function Screening: Yes   Immunization & Health Maintenance Record Immunization History  Administered Date(s) Administered  . Influenza, High Dose Seasonal PF 05/07/2017, 04/25/2020  . Influenza, Quadrivalent, Recombinant, Inj, Pf 04/21/2019  . Influenza,inj,Quad PF,6+ Mos 04/16/2018  . Influenza-Unspecified 04/16/2016  . Moderna SARS-COVID-2 Vaccination 08/26/2019, 09/23/2019  . Pneumococcal Conjugate-13 11/18/2015  . Pneumococcal Polysaccharide-23 12/27/2016    Health Maintenance  Topic Date Due  . TETANUS/TDAP  Never done  . MAMMOGRAM  05/26/2021  . COLONOSCOPY  03/15/2027  . INFLUENZA VACCINE  Completed  . DEXA SCAN  Completed  . COVID-19 Vaccine  Completed  . Hepatitis C Screening  Completed  . PNA vac Low Risk Adult  Completed       Assessment  This is a routine wellness examination for Lindsey Phillips.  Health Maintenance: Due or Overdue Health Maintenance Due  Topic Date Due  . TETANUS/TDAP  Never done    Lindsey Phillips does not need a referral for Community Assistance: Care Management:   no Social Work:    no Prescription Assistance:  no Nutrition/Diabetes Education:  no   Plan:  Personalized Goals Goals Addressed            This Visit's Progress   . Exercise 3x per week (30 min per time)       [employer] and include participating in [employer] group or stationary bike/recumbent stepper in current exercise regimen.    05/27/2020 AWV Goal: Exercise for General Health   Patient will verbalize understanding of the benefits of increased physical activity:  Exercising regularly is important. It will improve your overall fitness, flexibility, and endurance.  Regular exercise also will improve your overall health. It can help you control your weight, reduce  stress, and improve your bone density.  Over the next year, patient will increase physical activity as tolerated with a goal of at least 150 minutes of moderate physical activity per week.  You can tell that you are exercising at a moderate intensity if your heart starts beating faster and you start breathing faster but can still hold a conversation.  Moderate-intensity exercise ideas include:  Walking 1 mile (1.6 km) in about 15 minutes  Biking  Hiking  Golfing  Dancing  Water aerobics  Patient will verbalize understanding of everyday activities that increase physical activity by providing examples like the following: ? Yard work, such as: ? Pushing a Surveyor, mining ? Raking and bagging leaves ? Washing your car ? Pushing a stroller ? Shoveling snow ? Gardening ? Washing windows or floors  Patient will be able to explain general safety guidelines for exercising:   Before you start a new exercise program, talk with your health care provider.  Do not exercise so much that you hurt yourself, feel dizzy, or get very short of breath.  Wear comfortable clothes and wear shoes with good support.  Drink plenty of water while you exercise to prevent dehydration or heat stroke.  Work out until your breathing and your heartbeat get faster.       Personalized Health Maintenance & Screening Recommendations  Td vaccine Advanced directives: has NO advanced directive - not interested in additional information  Lung Cancer Screening Recommended: no (Low Dose CT Chest recommended if Age 70-80 years, 30 pack-year currently smoking OR have quit w/in past 15 years) Hepatitis C Screening recommended: no HIV Screening recommended: no  Advanced Directives: Written information was not prepared per patient's request.  Referrals & Orders No orders of the defined types were placed in this encounter.   Follow-up Plan . Follow-up with Bennie Pierini, FNP as planned . Schedule  yearly Eye Exam    I have personally reviewed and noted the following in the patient's chart:   . Medical and social history . Use of alcohol, tobacco or illicit drugs  . Current medications and supplements . Functional ability and status . Nutritional status . Physical activity . Advanced directives . List of other physicians . Hospitalizations, surgeries, and ER visits in previous 12 months . Vitals . Screenings to include cognitive, depression, and falls . Referrals and appointments  In addition, I have reviewed and discussed with Lindsey Phillips certain preventive protocols, quality metrics, and best practice recommendations. A written personalized care plan for preventive services as well as general preventive health recommendations is available and can be mailed to the patient at her request.      Caryl Bis, LPN  22/48/2500  AVS Printed and Mailed to Patient

## 2020-07-01 ENCOUNTER — Encounter: Payer: Self-pay | Admitting: Nurse Practitioner

## 2020-07-01 ENCOUNTER — Ambulatory Visit (INDEPENDENT_AMBULATORY_CARE_PROVIDER_SITE_OTHER): Payer: Medicare Other | Admitting: Nurse Practitioner

## 2020-07-01 ENCOUNTER — Other Ambulatory Visit: Payer: Self-pay

## 2020-07-01 VITALS — BP 135/86 | HR 81 | Temp 96.7°F | Resp 20 | Ht 68.0 in | Wt 212.0 lb

## 2020-07-01 DIAGNOSIS — E785 Hyperlipidemia, unspecified: Secondary | ICD-10-CM | POA: Diagnosis not present

## 2020-07-01 DIAGNOSIS — E559 Vitamin D deficiency, unspecified: Secondary | ICD-10-CM | POA: Diagnosis not present

## 2020-07-01 DIAGNOSIS — Z6828 Body mass index (BMI) 28.0-28.9, adult: Secondary | ICD-10-CM | POA: Diagnosis not present

## 2020-07-01 MED ORDER — ATORVASTATIN CALCIUM 40 MG PO TABS: 40.0000 mg | ORAL_TABLET | Freq: Every day | ORAL | 1 refills | Status: DC

## 2020-07-01 NOTE — Patient Instructions (Signed)

## 2020-07-01 NOTE — Progress Notes (Signed)
Subjective:    Patient ID: Lindsey Phillips, female    DOB: [birth year], 69 y.o.   MRN: [id number]  Chief Complaint: Medical Management of Chronic Issues    HPI:  1. Hyperlipidemia with target LDL less than 100 She tries to watch diet ,  But has not been doing well. She does little to no exercise. Lab Results  Component Value Date   CHOL 154 12/25/2019   HDL 31 (L) 12/25/2019   LDLCALC 100 (H) 12/25/2019   TRIG 128 12/25/2019   CHOLHDL 5.0 (H) 12/25/2019     2. Vitamin D deficiency Is on vitamind and calcium supplement  3. BMI 28.0-28.9,adult No recent weight changes Wt Readings from Last 3 Encounters:  07/01/20 212 lb (96.2 kg)  05/27/20 214 lb (97.1 kg)  12/25/19 214 lb (97.1 kg)   BMI Readings from Last 3 Encounters:  07/01/20 32.23 kg/m  05/27/20 32.54 kg/m  12/25/19 32.54 kg/m       Outpatient Encounter Medications as of 07/01/2020  Medication Sig  . atorvastatin (LIPITOR) 40 MG tablet Take 1 tablet (40 mg total) by mouth daily.  . calcium carbonate (OS-CAL) 1250 (500 Ca) MG chewable tablet Chew 1 tablet by mouth daily.  . Cholecalciferol (VITAMIN D3) 1000 units CAPS Take 1 capsule by mouth daily.   Facility-Administered Encounter Medications as of 07/01/2020  Medication  . 0.9 %  sodium chloride infusion    Past Surgical History:  Procedure Laterality Date  . ABDOMINAL HYSTERECTOMY    . KNEE ARTHROSCOPY Right 07/03/2016   Procedure: RIGHT KNEE ARTHROSCOPY;  Surgeon: Vickki Hearing, MD;  Location: [facility];  Service: Orthopedics;  Laterality: Right;    Family History  Problem Relation Age of Onset  . Hypertension Mother   . Diabetes Mother   . Heart disease Mother   . Kidney disease Mother   . Diabetes Father   . Hypertension Sister   . Diabetes Brother   . Hypertension Brother   . Hypertension Sister   . Diabetes Brother   . Hypertension Brother   . Diabetes Brother   . Hypertension Brother   . Diabetes Brother   . Kidney disease  Brother   . Colon cancer Neg Hx     New complaints: No complaints  Social history: Retired from [employer] - lives by herself  Controlled substance contract: n/a    Review of Systems  Constitutional: Negative for diaphoresis.  Eyes: Negative for pain.  Respiratory: Negative for shortness of breath.   Cardiovascular: Negative for chest pain, palpitations and leg swelling.  Gastrointestinal: Negative for abdominal pain.  Endocrine: Negative for polydipsia.  Skin: Negative for rash.  Neurological: Negative for dizziness, weakness and headaches.  Hematological: Does not bruise/bleed easily.  All other systems reviewed and are negative.      Objective:   Physical Exam Vitals and nursing note reviewed.  Constitutional:      General: She is not in acute distress.    Appearance: Normal appearance. She is well-developed and well-nourished.  HENT:     Head: Normocephalic.     Nose: Nose normal.     Mouth/Throat:     Mouth: Oropharynx is clear and moist.  Eyes:     Extraocular Movements: EOM normal.     Pupils: Pupils are equal, round, and reactive to light.  Neck:     Vascular: No carotid bruit or JVD.  Cardiovascular:     Rate and Rhythm: Normal rate and regular rhythm.  Pulses: Intact distal pulses.     Heart sounds: Normal heart sounds.  Pulmonary:     Effort: Pulmonary effort is normal. No respiratory distress.     Breath sounds: Normal breath sounds. No wheezing or rales.  Chest:     Chest wall: No tenderness.  Abdominal:     General: Bowel sounds are normal. There is no distension or abdominal bruit. Aorta is normal.     Palpations: Abdomen is soft. There is no hepatomegaly, splenomegaly, mass or pulsatile mass.     Tenderness: There is no abdominal tenderness.  Musculoskeletal:        General: No edema. Normal range of motion.     Cervical back: Normal range of motion and neck supple.  Lymphadenopathy:     Cervical: No cervical adenopathy.  Skin:    General:  Skin is warm and dry.  Neurological:     Mental Status: She is alert and oriented to person, place, and time.     Deep Tendon Reflexes: Reflexes are normal and symmetric.  Psychiatric:        Mood and Affect: Mood and affect normal.        Behavior: Behavior normal.        Thought Content: Thought content normal.        Judgment: Judgment normal.    BP 135/86   Pulse 81   Temp (!) 96.7 F (35.9 C) (Temporal)   Resp 20   Ht 5\' 8"  (1.727 m)   Wt 212 lb (96.2 kg)   BMI 32.23 kg/m        Assessment & Plan:  Lindsey Phillips comes in today with chief complaint of Medical Management of Chronic Issues   Diagnosis and orders addressed:  1. Hyperlipidemia with target LDL less than 100 Low fat diet - atorvastatin (LIPITOR) 40 MG tablet; Take 1 tablet (40 mg total) by mouth daily.  Dispense: 90 tablet; Refill: 1  2. Vitamin D deficiency Continue vitamind and calcium supplement  3. BMI 28.0-28.9,adult Discussed diet and exercise for person with BMI >25 Will recheck weight in 3-6 months   Labs pending Health Maintenance reviewed Diet and exercise encouraged  Follow up plan: 6 months   Mary-Margaret 08-27-1998, FNP

## 2020-12-30 ENCOUNTER — Ambulatory Visit (INDEPENDENT_AMBULATORY_CARE_PROVIDER_SITE_OTHER): Payer: Medicare Other | Admitting: Nurse Practitioner

## 2020-12-30 ENCOUNTER — Encounter: Payer: Self-pay | Admitting: Nurse Practitioner

## 2020-12-30 ENCOUNTER — Other Ambulatory Visit: Payer: Self-pay

## 2020-12-30 VITALS — BP 107/70 | HR 66 | Temp 97.6°F | Resp 20 | Ht 68.0 in | Wt 206.0 lb

## 2020-12-30 DIAGNOSIS — M9262 Juvenile osteochondrosis of tarsus, left ankle: Secondary | ICD-10-CM

## 2020-12-30 DIAGNOSIS — Z6828 Body mass index (BMI) 28.0-28.9, adult: Secondary | ICD-10-CM | POA: Diagnosis not present

## 2020-12-30 DIAGNOSIS — E785 Hyperlipidemia, unspecified: Secondary | ICD-10-CM | POA: Diagnosis not present

## 2020-12-30 LAB — CBC WITH DIFFERENTIAL/PLATELET
Basophils Absolute: 0 10*3/uL (ref 0.0–0.2)
Basos: 1 %
EOS (ABSOLUTE): 0.1 10*3/uL (ref 0.0–0.4)
Eos: 2 %
Hematocrit: 39.5 % (ref 34.0–46.6)
Hemoglobin: 12.7 g/dL (ref 11.1–15.9)
Immature Grans (Abs): 0 10*3/uL (ref 0.0–0.1)
Immature Granulocytes: 0 %
Lymphocytes Absolute: 2.4 10*3/uL (ref 0.7–3.1)
Lymphs: 39 %
MCH: 26 pg — ABNORMAL LOW (ref 26.6–33.0)
MCHC: 32.2 g/dL (ref 31.5–35.7)
MCV: 81 fL (ref 79–97)
Monocytes Absolute: 0.5 10*3/uL (ref 0.1–0.9)
Monocytes: 7 %
Neutrophils Absolute: 3.3 10*3/uL (ref 1.4–7.0)
Neutrophils: 51 %
Platelets: 326 10*3/uL (ref 150–450)
RBC: 4.88 x10E6/uL (ref 3.77–5.28)
RDW: 14.5 % (ref 11.7–15.4)
WBC: 6.3 10*3/uL (ref 3.4–10.8)

## 2020-12-30 LAB — CMP14+EGFR
ALT: 16 IU/L (ref 0–32)
AST: 16 IU/L (ref 0–40)
Albumin/Globulin Ratio: 1.4 (ref 1.2–2.2)
Albumin: 4.6 g/dL (ref 3.8–4.8)
Alkaline Phosphatase: 164 IU/L — ABNORMAL HIGH (ref 44–121)
BUN/Creatinine Ratio: 20 (ref 12–28)
BUN: 20 mg/dL (ref 8–27)
Bilirubin Total: 0.4 mg/dL (ref 0.0–1.2)
CO2: 24 mmol/L (ref 20–29)
Calcium: 10.5 mg/dL — ABNORMAL HIGH (ref 8.7–10.3)
Chloride: 101 mmol/L (ref 96–106)
Creatinine, Ser: 1.02 mg/dL — ABNORMAL HIGH (ref 0.57–1.00)
Globulin, Total: 3.3 g/dL (ref 1.5–4.5)
Glucose: 115 mg/dL — ABNORMAL HIGH (ref 65–99)
Potassium: 4.9 mmol/L (ref 3.5–5.2)
Sodium: 138 mmol/L (ref 134–144)
Total Protein: 7.9 g/dL (ref 6.0–8.5)
eGFR: 59 mL/min/{1.73_m2} — ABNORMAL LOW (ref >59)

## 2020-12-30 LAB — LIPID PANEL
Chol/HDL Ratio: 5.8 ratio — ABNORMAL HIGH (ref 0.0–4.4)
Cholesterol, Total: 180 mg/dL (ref 100–199)
HDL: 31 mg/dL — ABNORMAL LOW (ref >39)
LDL Chol Calc (NIH): 122 mg/dL — ABNORMAL HIGH (ref 0–99)
Triglycerides: 149 mg/dL (ref 0–149)
VLDL Cholesterol Cal: 27 mg/dL (ref 5–40)

## 2020-12-30 MED ORDER — ATORVASTATIN CALCIUM 40 MG PO TABS: 40.0000 mg | ORAL_TABLET | Freq: Every day | ORAL | 1 refills | Status: DC

## 2020-12-30 NOTE — Patient Instructions (Signed)
Exercise Information for Aging Adults Staying physically active is important as you age. The four types of exercises that are best for older adults are endurance, strength, balance, and flexibility. Contact your health care provider before you start any exercise routine. Ask your health care provider what activities are safe for you. What are the risks? Risks associated with exercising include: Overdoing it. This may lead to sore muscles or fatigue. Falls. Injuries. Dehydration. How to do these exercises Endurance exercises Endurance (aerobic) exercises raise your breathing rate and heart rate. Increasing your endurance helps you to do everyday tasks and stay healthy. By improving the health of your body system that includes your heart, lungs, and blood vessels (circulatory system), you may also delay or prevent diseases such as heart disease, diabetes, and bone loss (osteoporosis). Types of endurance exercises include: Sports. Indoor activities, such as using gym equipment, doing water aerobics, or dancing. Outdoor activities, such as biking or jogging. Tasks around the house, such as gardening, yard work, and heavy household chores like cleaning. Walking, such as hiking or walking around your neighborhood. When doing endurance exercises, make sure you: Are aware of your surroundings. Use safety equipment as directed. Dress in layers when exercising outdoors. Drink plenty of water to stay well hydrated. Build up endurance slowly. Start with 10 minutes at a time, and gradually build up to doing 30 minutes at a time. Unless your health care provider gave you different instructions, aim to exercise for a total of 150 minutes a week. Spread out that time so you are working on endurance on 3 or more days a week. Strength exercises Lifting, pulling, or pushing weights helps to strengthen muscles. Having stronger muscles makes it easier to do everyday activities, such as getting up from a chair,  climbing stairs, carrying groceries, and playing with grandchildren. Strength exercises include arm and leg exercises that may be done: With weights. Without weights (using your own body weight). With a resistance band. When doing strength exercises: Move smoothly and steadily. Do not suddenly thrust or jerk the weights, the resistance band, or your body. Start with no weights or with light weights, and gradually add more weight over time. Eventually, aim to use weights that are hard or very hard for you to lift. This means that you are able to do 8 repetitions with the weight, and the last few repetitions are very challenging. Lift or push weights into position for 3 seconds, hold the position for 1 second, and then take 3 seconds to return to your starting position. Breathe out (exhale) during difficult movements, like lifting or pushing weights. Breathe in (inhale) to relax your muscles before the next repetition. Consider alternating arms or legs, especially when you first start strength exercises. Expect some slight muscle soreness after each session. Do strength exercises on 2 or more days a week, for 30 minutes at a time. Avoid exercising the same muscle groups two days in a row. For example, if you work on your leg muscles one day, work on your arm muscles the next day. When you can do two sets of 10-15 repetitions with a certain weight, increase the amount of weight. Balance Balance exercises can help to prevent falls. Balance exercises include: Standing on one foot. Heel-to-toe walk. Balance walk. Tai chi. Make sure you have something sturdy to hold onto while doing balance exercises, such as a sturdy chair. As your balance improves, challenge yourself by holding onto the chair with one hand instead of two, and then   with no hands. Trying exercises with your eyes closed also challenges your balance, but be sure to have a sturdy surface (like a countertop) close by in case you need it. Do  balance exercises as often as you want, or as often as directed by your health care provider. Strength exercises for the lower body also help to improve balance. Flexibility Flexibility exercises improve how far you can bend, straighten, move, or rotate parts of your body (range of motion). These exercises also help you to do everyday activities such as getting dressed or reaching for objects. Flexibility exercises include stretching different parts of the body, and they may be done in a standing or seated position or on the floor. When stretching, make sure you: Keep a slight bend in your arms and legs. Avoid completely straightening ("locking") your joints. Do not stretch so far that you feel pain. You should feel a mild stretching feeling. You may try stretching farther as you become more flexible over time. Relax and breathe between stretches. Hold onto something sturdy for balance as needed. Hold each stretch for 10-30 seconds. Repeat each stretch 3-5 times. General safety tips Exercise in well-lit areas. Do not hold your breath during exercises or stretches. Warm up before exercising, and cool down after exercising. This can help prevent injury. Drink plenty of water during exercise or any activity that makes you sweat. Use smooth, steady movements. Do not use sudden, jerking movements, especially when lifting weights or doing flexibility exercises. If you are not sure if an exercise is safe for you, or you are not sure how to do an exercise, talk with your health care provider. This is especially important if you have had surgery on muscles, bones, or joints (orthopedic surgery). Where to find more information You can find more information about exercise for older adults from: Your local health department, fitness center, or community center. These facilities may have programs for aging adults. National Institute on Aging: www.nia.nih.gov National Council on Aging:  www.ncoa.org Summary Staying physically active is important as you age. Make sure to contact your health care provider before you start any exercise routine. Ask your health care provider what activities are safe for you. Doing endurance, strength, balance, and flexibility exercises can help to delay or prevent certain diseases, such as heart disease, diabetes, and bone loss (osteoporosis). This information is not intended to replace advice given to you by your health care provider. Make sure you discuss any questions you have with your health care provider. Document Revised: 10/22/2019 Document Reviewed: 10/22/2019 Elsevier Patient Education  2022 [employer]  

## 2020-12-30 NOTE — Progress Notes (Signed)
Subjective:    Patient ID: Lindsey Phillips, female    DOB: 04-24-1951, 70 y.o.   MRN: [id number]   Chief Complaint: Medical Management of Chronic Issues    HPI:  1. Hyperlipidemia with target LDL less than 100 Lab Results  Component Value Date   CHOL 154 12/25/2019   HDL 31 (L) 12/25/2019   LDLCALC 100 (H) 12/25/2019   TRIG 128 12/25/2019   CHOLHDL 5.0 (H) 12/25/2019  Takes medication as prescribed. Does not eat a lot of fried foods or fatty foods. Eats outs but chooses healthy foods.    2. BMI 28.0-28.9,adult Wt Readings from Last 3 Encounters:  12/30/20 206 lb (93.4 kg)  07/01/20 212 lb (96.2 kg)  05/27/20 214 lb (97.1 kg)   BMI Readings from Last 3 Encounters:  12/30/20 31.32 kg/m  07/01/20 32.23 kg/m  05/27/20 32.54 kg/m   Walks daily. No other formal exercise.     Outpatient Encounter Medications as of 12/30/2020  Medication Sig   atorvastatin (LIPITOR) 40 MG tablet Take 1 tablet (40 mg total) by mouth daily.   calcium carbonate (OS-CAL) 1250 (500 Ca) MG chewable tablet Chew 1 tablet by mouth daily.   Cholecalciferol (VITAMIN D3) 1000 units CAPS Take 1 capsule by mouth daily.   Facility-Administered Encounter Medications as of 12/30/2020  Medication   0.9 %  sodium chloride infusion    Past Surgical History:  Procedure Laterality Date   ABDOMINAL HYSTERECTOMY     KNEE ARTHROSCOPY Right 07/03/2016   Procedure: RIGHT KNEE ARTHROSCOPY;  Surgeon: Vickki Hearing, MD;  Location: [facility];  Service: Orthopedics;  Laterality: Right;    Family History  Problem Relation Age of Onset   Hypertension Mother    Diabetes Mother    Heart disease Mother    Kidney disease Mother    Diabetes Father    Hypertension Sister    Diabetes Brother    Hypertension Brother    Hypertension Sister    Diabetes Brother    Hypertension Brother    Diabetes Brother    Hypertension Brother    Diabetes Brother    Kidney disease Brother    Colon cancer Neg Hx     New  complaints: States off and on left heal pain, states she cannot wear shoes with a heal x 1 week. Denies any injury. Takes ibuprofen and arthritis cream with relief.   Social history: Lives at home alone, sister lives right above her.   Controlled substance contract: n/a   Review of Systems  Constitutional: Negative.   HENT: Negative.    Eyes: Negative.   Respiratory: Negative.    Cardiovascular: Negative.   Gastrointestinal: Negative.   Endocrine: Negative.   Genitourinary: Negative.   Musculoskeletal:  Positive for gait problem.       Left heal pain when wearing closed toed shoes  Skin: Negative.   Allergic/Immunologic: Negative.   Hematological: Negative.   Psychiatric/Behavioral: Negative.    All other systems reviewed and are negative.     Objective:   Physical Exam Vitals and nursing note reviewed.  Constitutional:      Appearance: Normal appearance.  HENT:     Head: Normocephalic and atraumatic.     Right Ear: Tympanic membrane normal.     Left Ear: Tympanic membrane normal.     Nose: Nose normal.     Mouth/Throat:     Mouth: Mucous membranes are moist.     Pharynx: Oropharynx is clear.  Eyes:  Extraocular Movements: Extraocular movements intact.     Pupils: Pupils are equal, round, and reactive to light.  Cardiovascular:     Rate and Rhythm: Normal rate and regular rhythm.     Pulses: Normal pulses.     Heart sounds: Normal heart sounds.  Pulmonary:     Effort: Pulmonary effort is normal.     Breath sounds: Normal breath sounds.  Abdominal:     General: Abdomen is flat.     Palpations: Abdomen is soft.  Musculoskeletal:        General: Normal range of motion.     Cervical back: Normal range of motion.     Right ankle: Normal.     Left ankle: Deformity present. Tenderness present.     Comments: Hagland's deformity noted to left heal  Skin:    General: Skin is warm and dry.     Capillary Refill: Capillary refill takes less than 2 seconds.   Neurological:     General: No focal deficit present.     Mental Status: She is alert and oriented to person, place, and time. Mental status is at baseline.  Psychiatric:        Mood and Affect: Mood normal.        Behavior: Behavior normal.        Thought Content: Thought content normal.        Judgment: Judgment normal.   BP 107/70   Pulse 66   Temp 97.6 F (36.4 C) (Temporal)   Resp 20   Ht 5\' 8"  (1.727 m)   Wt 206 lb (93.4 kg)   SpO2 95%   BMI 31.32 kg/m       Assessment & Plan:   Lindsey Phillips comes in today with chief complaint of Medical Management of Chronic Issues   Diagnosis and orders addressed:  1. Hyperlipidemia with target LDL less than 100 Take medication as prescribed. Avoid foods that are high in fat or fried. Eat a well balanced diet of healthy foods.   2. BMI 28.0-28.9,adult Exercise regularly. Walking is a great cardiovascular exercise that will help lower cholesterol levels and maintain a healthy weight.   3. Hagland's Deformity  Use ice and compression during episodes. Wear achilles heal pads for relief. Report any new or worsening symptoms.   Labs pending Health Maintenance reviewed Diet and exercise encouraged  Follow up plan: Follow up in 6 months.    Mary-Margaret 08-27-1998, FNP

## 2021-01-08 ENCOUNTER — Other Ambulatory Visit: Payer: Self-pay | Admitting: Nurse Practitioner

## 2021-01-08 DIAGNOSIS — Z1231 Encounter for screening mammogram for malignant neoplasm of breast: Secondary | ICD-10-CM

## 2021-02-14 DIAGNOSIS — H35342 Macular cyst, hole, or pseudohole, left eye: Secondary | ICD-10-CM | POA: Diagnosis not present

## 2021-02-14 DIAGNOSIS — H25813 Combined forms of age-related cataract, bilateral: Secondary | ICD-10-CM | POA: Diagnosis not present

## 2021-02-14 DIAGNOSIS — H40023 Open angle with borderline findings, high risk, bilateral: Secondary | ICD-10-CM | POA: Diagnosis not present

## 2021-02-14 LAB — HM DIABETES EYE EXAM

## 2021-02-21 DIAGNOSIS — H43821 Vitreomacular adhesion, right eye: Secondary | ICD-10-CM | POA: Diagnosis not present

## 2021-02-21 DIAGNOSIS — H25013 Cortical age-related cataract, bilateral: Secondary | ICD-10-CM | POA: Diagnosis not present

## 2021-02-21 DIAGNOSIS — H35342 Macular cyst, hole, or pseudohole, left eye: Secondary | ICD-10-CM | POA: Diagnosis not present

## 2021-02-21 DIAGNOSIS — H2513 Age-related nuclear cataract, bilateral: Secondary | ICD-10-CM | POA: Diagnosis not present

## 2021-03-04 DIAGNOSIS — H35342 Macular cyst, hole, or pseudohole, left eye: Secondary | ICD-10-CM | POA: Diagnosis not present

## 2021-03-04 DIAGNOSIS — H25013 Cortical age-related cataract, bilateral: Secondary | ICD-10-CM | POA: Diagnosis not present

## 2021-03-04 DIAGNOSIS — H524 Presbyopia: Secondary | ICD-10-CM | POA: Diagnosis not present

## 2021-03-17 HISTORY — PX: CATARACT EXTRACTION: SUR2

## 2021-03-23 DIAGNOSIS — H2512 Age-related nuclear cataract, left eye: Secondary | ICD-10-CM | POA: Diagnosis not present

## 2021-03-23 DIAGNOSIS — H25012 Cortical age-related cataract, left eye: Secondary | ICD-10-CM | POA: Diagnosis not present

## 2021-03-23 DIAGNOSIS — H35342 Macular cyst, hole, or pseudohole, left eye: Secondary | ICD-10-CM | POA: Diagnosis not present

## 2021-03-23 DIAGNOSIS — H33332 Multiple defects of retina without detachment, left eye: Secondary | ICD-10-CM | POA: Diagnosis not present

## 2021-04-14 DIAGNOSIS — H35342 Macular cyst, hole, or pseudohole, left eye: Secondary | ICD-10-CM | POA: Diagnosis not present

## 2021-04-18 DIAGNOSIS — R631 Polydipsia: Secondary | ICD-10-CM | POA: Diagnosis not present

## 2021-04-18 DIAGNOSIS — R5383 Other fatigue: Secondary | ICD-10-CM | POA: Diagnosis not present

## 2021-04-18 DIAGNOSIS — R350 Frequency of micturition: Secondary | ICD-10-CM | POA: Diagnosis not present

## 2021-04-18 DIAGNOSIS — R739 Hyperglycemia, unspecified: Secondary | ICD-10-CM | POA: Diagnosis not present

## 2021-04-19 DIAGNOSIS — R350 Frequency of micturition: Secondary | ICD-10-CM | POA: Diagnosis not present

## 2021-04-19 DIAGNOSIS — R5383 Other fatigue: Secondary | ICD-10-CM | POA: Diagnosis not present

## 2021-04-19 DIAGNOSIS — R739 Hyperglycemia, unspecified: Secondary | ICD-10-CM | POA: Diagnosis not present

## 2021-04-20 ENCOUNTER — Ambulatory Visit (INDEPENDENT_AMBULATORY_CARE_PROVIDER_SITE_OTHER): Payer: Medicare Other | Admitting: Nurse Practitioner

## 2021-04-20 ENCOUNTER — Other Ambulatory Visit: Payer: Self-pay

## 2021-04-20 ENCOUNTER — Encounter: Payer: Self-pay | Admitting: Nurse Practitioner

## 2021-04-20 VITALS — BP 124/82 | HR 83 | Temp 96.8°F | Resp 20 | Ht 68.0 in | Wt 200.0 lb

## 2021-04-20 DIAGNOSIS — E559 Vitamin D deficiency, unspecified: Secondary | ICD-10-CM

## 2021-04-20 DIAGNOSIS — E1165 Type 2 diabetes mellitus with hyperglycemia: Secondary | ICD-10-CM

## 2021-04-20 DIAGNOSIS — Z6830 Body mass index (BMI) 30.0-30.9, adult: Secondary | ICD-10-CM

## 2021-04-20 DIAGNOSIS — E785 Hyperlipidemia, unspecified: Secondary | ICD-10-CM

## 2021-04-20 MED ORDER — METFORMIN HCL 1000 MG PO TABS: 1000.0000 mg | ORAL_TABLET | Freq: Two times a day (BID) | ORAL | 3 refills | Status: DC

## 2021-04-20 MED ORDER — BLOOD GLUCOSE METER KIT: PACK | 0 refills | Status: AC

## 2021-04-20 NOTE — Progress Notes (Signed)
Subjective:    Patient ID: Lindsey Phillips, female    DOB: 1950-11-11, 70 y.o.   MRN: [id number]   Chief Complaint: Hyperglycemia    HPI:  1. Hyperlipidemia with target LDL less than 100 Does try to watch diet but does no dedicated exercise.is on daily dose of lipitor. Lab Results  Component Value Date   CHOL 180 12/30/2020   HDL 31 (L) 12/30/2020   LDLCALC 122 (H) 12/30/2020   TRIG 149 12/30/2020   CHOLHDL 5.8 (H) 12/30/2020     2. Vitamin D deficiency On daily supplement  3. BMI 31.0-31.9,adult Weight is down 6 lbs Wt Readings from Last 3 Encounters:  04/20/21 200 lb (90.7 kg)  12/30/20 206 lb (93.4 kg)  07/01/20 212 lb (96.2 kg)    BMI Readings from Last 3 Encounters:  04/20/21 30.41 kg/m  12/30/20 31.32 kg/m  07/01/20 32.23 kg/m       Outpatient Encounter Medications as of 04/20/2021  Medication Sig   atorvastatin (LIPITOR) 40 MG tablet Take 1 tablet (40 mg total) by mouth daily.   calcium carbonate (OS-CAL) 1250 (500 Ca) MG chewable tablet Chew 1 tablet by mouth daily.   Cholecalciferol (VITAMIN D3) 1000 units CAPS Take 1 capsule by mouth daily.   prednisoLONE acetate (PRED FORTE) 1 % ophthalmic suspension 1 drop 4 (four) times daily.   Facility-Administered Encounter Medications as of 04/20/2021  Medication   0.9 %  sodium chloride infusion    Past Surgical History:  Procedure Laterality Date   ABDOMINAL HYSTERECTOMY     CATARACT EXTRACTION  03/2021   KNEE ARTHROSCOPY Right 07/03/2016   Procedure: RIGHT KNEE ARTHROSCOPY;  Surgeon: Carole Civil, MD;  Location: [facility];  Service: Orthopedics;  Laterality: Right;    Family History  Problem Relation Age of Onset   Hypertension Mother    Diabetes Mother    Heart disease Mother    Kidney disease Mother    Diabetes Father    Hypertension Sister    Diabetes Brother    Hypertension Brother    Hypertension Sister    Diabetes Brother    Hypertension Brother    Diabetes Brother    Hypertension  Brother    Diabetes Brother    Kidney disease Brother    Colon cancer Neg Hx     New complaints: Patient went to urgent care on Monday. Patient said she had been real thisry for 3-4 days so her sister checked her blood sugar and it would not register. They gave her some insulin to bring it down and 12u to take at home  yesterday. Last night her blood sugar was 415. Her hgba1c at urgent care was 12.3%  Social history: Lives by herself.  Controlled substance contract: n/a      Review of Systems  Constitutional:  Negative for diaphoresis.  Eyes:  Negative for pain.  Respiratory:  Negative for shortness of breath.   Cardiovascular:  Negative for chest pain, palpitations and leg swelling.  Gastrointestinal:  Negative for abdominal pain.  Endocrine: Negative for polydipsia.  Skin:  Negative for rash.  Neurological:  Negative for dizziness, weakness and headaches.  Hematological:  Does not bruise/bleed easily.  All other systems reviewed and are negative.     Objective:   Physical Exam Vitals and nursing note reviewed.  Constitutional:      Appearance: Normal appearance. She is obese.  Cardiovascular:     Rate and Rhythm: Normal rate and regular rhythm.  Heart sounds: Normal heart sounds.  Pulmonary:     Effort: Pulmonary effort is normal.     Breath sounds: Normal breath sounds.  Skin:    General: Skin is warm.  Neurological:     General: No focal deficit present.     Mental Status: She is alert and oriented to person, place, and time.  Psychiatric:        Mood and Affect: Mood normal.        Behavior: Behavior normal.      BP 124/82   Pulse 83   Temp (!) 96.8 F (36 C) (Temporal)   Resp 20   Ht 5' 8"  (1.727 m)   Wt 200 lb (90.7 kg)   SpO2 97%   BMI 30.41 kg/m      Assessment & Plan:   Lindsey Phillips comes in today with chief complaint of Hyperglycemia   Diagnosis and orders addressed:  1. Hyperlipidemia with target LDL less than 100 Low fat  diet  2. Vitamin D deficiency Continue daily vitamin d   3. BMI 30.0-30.9,adult Discussed diet and exercise for person with BMI >25 Will recheck weight in 3-6 months   4. Uncontrolled type 2 diabetes mellitus with hyperglycemia ([place]) Keep dairy of fasting blood sugar daily Carb counting discussed- stop soft drinks Report any bad diarrhea - metFORMIN (GLUCOPHAGE) 1000 MG tablet; Take 1 tablet (1,000 mg total) by mouth 2 (two) times daily with a meal.  Dispense: 180 tablet; Refill: 3 - CBC with Differential/Platelet - CMP14+EGFR   Labs pending Health Maintenance reviewed Diet and exercise encouraged  Follow up plan: 1 month   [place], FNP

## 2021-04-20 NOTE — Patient Instructions (Signed)
Carbohydrate Counting for Diabetes Mellitus, Adult Carbohydrate counting is a method of keeping track of how many carbohydrates you eat. Eating carbohydrates naturally increases the amount of sugar (glucose) in the blood. Counting how many carbohydrates you eat improves your blood glucose control, which helps you manage your diabetes. It is important to know how many carbohydrates you can safely have in each meal. This is different for every person. A dietitian can help you make a meal plan and calculate how many carbohydrates you should have at each meal and snack. What foods contain carbohydrates? Carbohydrates are found in the following foods: Grains, such as breads and cereals. Dried beans and soy products. Starchy vegetables, such as potatoes, peas, and corn. Fruit and fruit juices. Milk and yogurt. Sweets and snack foods, such as cake, cookies, candy, chips, and soft drinks. How do I count carbohydrates in foods? There are two ways to count carbohydrates in food. You can read food labels or learn standard serving sizes of foods. You can use either of the methods or a combination of both. Using the Nutrition Facts label The Nutrition Facts list is included on the labels of almost all packaged foods and beverages in the [place] It includes: The serving size. Information about nutrients in each serving, including the grams (g) of carbohydrate per serving. To use the Nutrition Facts: Decide how many servings you will have. Multiply the number of servings by the number of carbohydrates per serving. The resulting number is the total amount of carbohydrates that you will be having. Learning the standard serving sizes of foods When you eat carbohydrate foods that are not packaged or do not include Nutrition Facts on the label, you need to measure the servings in order to count the amount of carbohydrates. Measure the foods that you will eat with a food scale or measuring cup, if needed. Decide how  many standard-size servings you will eat. Multiply the number of servings by 15. For foods that contain carbohydrates, one serving equals 15 g of carbohydrates. For example, if you eat 2 cups or 10 oz (300 g) of strawberries, you will have eaten 2 servings and 30 g of carbohydrates (2 servings x 15 g = 30 g). For foods that have more than one food mixed, such as soups and casseroles, you must count the carbohydrates in each food that is included. The following list contains standard serving sizes of common carbohydrate-rich foods. Each of these servings has about 15 g of carbohydrates: 1 slice of bread. 1 six-inch (15 cm) tortilla. ? cup or 2 oz (53 g) cooked rice or pasta.  cup or 3 oz (85 g) cooked or canned, drained and rinsed beans or lentils.  cup or 3 oz (85 g) starchy vegetable, such as peas, corn, or squash.  cup or 4 oz (120 g) hot cereal.  cup or 3 oz (85 g) boiled or mashed potatoes, or  or 3 oz (85 g) of a large baked potato.  cup or 4 fl oz (118 mL) fruit juice. 1 cup or 8 fl oz (237 mL) milk. 1 small or 4 oz (106 g) apple.  or 2 oz (63 g) of a medium banana. 1 cup or 5 oz (150 g) strawberries. 3 cups or 1 oz (24 g) popped popcorn. What is an example of carbohydrate counting? To calculate the number of carbohydrates in this sample meal, follow the steps shown below. Sample meal 3 oz (85 g) chicken breast. ? cup or 4 oz (106 g) brown   rice.  cup or 3 oz (85 g) corn. 1 cup or 8 fl oz (237 mL) milk. 1 cup or 5 oz (150 g) strawberries with sugar-free whipped topping. Carbohydrate calculation Identify the foods that contain carbohydrates: Rice. Corn. Milk. Strawberries. Calculate how many servings you have of each food: 2 servings rice. 1 serving corn. 1 serving milk. 1 serving strawberries. Multiply each number of servings by 15 g: 2 servings rice x 15 g = 30 g. 1 serving corn x 15 g = 15 g. 1 serving milk x 15 g = 15 g. 1 serving strawberries x 15 g = 15  g. Add together all of the amounts to find the total grams of carbohydrates eaten: 30 g + 15 g + 15 g + 15 g = 75 g of carbohydrates total. What are tips for following this plan? Shopping Develop a meal plan and then make a shopping list. Buy fresh and frozen vegetables, fresh and frozen fruit, dairy, eggs, beans, lentils, and whole grains. Look at food labels. Choose foods that have more fiber and less sugar. Avoid processed foods and foods with added sugars. Meal planning Aim to have the same amount of carbohydrates at each meal and for each snack time. Plan to have regular, balanced meals and snacks. Where to find more information American Diabetes Association: www.diabetes.org Centers for Disease Control and Prevention: www.cdc.gov Summary Carbohydrate counting is a method of keeping track of how many carbohydrates you eat. Eating carbohydrates naturally increases the amount of sugar (glucose) in the blood. Counting how many carbohydrates you eat improves your blood glucose control, which helps you manage your diabetes. A dietitian can help you make a meal plan and calculate how many carbohydrates you should have at each meal and snack. This information is not intended to replace advice given to you by your health care provider. Make sure you discuss any questions you have with your health care provider. Document Revised: 07/03/2019 Document Reviewed: 07/04/2019 Elsevier Patient Education  2021 Elsevier Inc.  

## 2021-05-12 ENCOUNTER — Other Ambulatory Visit: Payer: Self-pay | Admitting: Nurse Practitioner

## 2021-05-17 ENCOUNTER — Encounter: Payer: Self-pay | Admitting: Nurse Practitioner

## 2021-05-17 ENCOUNTER — Ambulatory Visit (INDEPENDENT_AMBULATORY_CARE_PROVIDER_SITE_OTHER): Payer: Medicare Other | Admitting: Nurse Practitioner

## 2021-05-17 ENCOUNTER — Other Ambulatory Visit: Payer: Self-pay

## 2021-05-17 VITALS — BP 124/77 | HR 98 | Temp 97.0°F | Resp 20 | Ht 68.0 in | Wt 200.0 lb

## 2021-05-17 DIAGNOSIS — E785 Hyperlipidemia, unspecified: Secondary | ICD-10-CM

## 2021-05-17 DIAGNOSIS — Z6828 Body mass index (BMI) 28.0-28.9, adult: Secondary | ICD-10-CM | POA: Diagnosis not present

## 2021-05-17 DIAGNOSIS — E1165 Type 2 diabetes mellitus with hyperglycemia: Secondary | ICD-10-CM

## 2021-05-17 DIAGNOSIS — E119 Type 2 diabetes mellitus without complications: Secondary | ICD-10-CM | POA: Diagnosis not present

## 2021-05-17 DIAGNOSIS — E559 Vitamin D deficiency, unspecified: Secondary | ICD-10-CM | POA: Diagnosis not present

## 2021-05-17 LAB — BAYER DCA HB A1C WAIVED: HB A1C (BAYER DCA - WAIVED): 9 % — ABNORMAL HIGH (ref 4.8–5.6)

## 2021-05-17 MED ORDER — ATORVASTATIN CALCIUM 40 MG PO TABS: 40.0000 mg | ORAL_TABLET | Freq: Every day | ORAL | 1 refills | Status: DC

## 2021-05-17 MED ORDER — METFORMIN HCL 1000 MG PO TABS: 1000.0000 mg | ORAL_TABLET | Freq: Two times a day (BID) | ORAL | 3 refills | Status: DC

## 2021-05-17 NOTE — Patient Instructions (Signed)
Carbohydrate Counting for Diabetes Mellitus, Adult Carbohydrate counting is a method of keeping track of how many carbohydrates you eat. Eating carbohydrates naturally increases the amount of sugar (glucose) in the blood. Counting how many carbohydrates you eat improves your blood glucose control, which helps you manage your diabetes. It is important to know how many carbohydrates you can safely have in each meal. This is different for every person. A dietitian can help you make a meal plan and calculate how many carbohydrates you should have at each meal and snack. What foods contain carbohydrates? Carbohydrates are found in the following foods: Grains, such as breads and cereals. Dried beans and soy products. Starchy vegetables, such as potatoes, peas, and corn. Fruit and fruit juices. Milk and yogurt. Sweets and snack foods, such as cake, cookies, candy, chips, and soft drinks. How do I count carbohydrates in foods? There are two ways to count carbohydrates in food. You can read food labels or learn standard serving sizes of foods. You can use either of the methods or a combination of both. Using the Nutrition Facts label The Nutrition Facts list is included on the labels of almost all packaged foods and beverages in the [place] It includes: The serving size. Information about nutrients in each serving, including the grams (g) of carbohydrate per serving. To use the Nutrition Facts: Decide how many servings you will have. Multiply the number of servings by the number of carbohydrates per serving. The resulting number is the total amount of carbohydrates that you will be having. Learning the standard serving sizes of foods When you eat carbohydrate foods that are not packaged or do not include Nutrition Facts on the label, you need to measure the servings in order to count the amount of carbohydrates. Measure the foods that you will eat with a food scale or measuring cup, if needed. Decide how  many standard-size servings you will eat. Multiply the number of servings by 15. For foods that contain carbohydrates, one serving equals 15 g of carbohydrates. For example, if you eat 2 cups or 10 oz (300 g) of strawberries, you will have eaten 2 servings and 30 g of carbohydrates (2 servings x 15 g = 30 g). For foods that have more than one food mixed, such as soups and casseroles, you must count the carbohydrates in each food that is included. The following list contains standard serving sizes of common carbohydrate-rich foods. Each of these servings has about 15 g of carbohydrates: 1 slice of bread. 1 six-inch (15 cm) tortilla. ? cup or 2 oz (53 g) cooked rice or pasta.  cup or 3 oz (85 g) cooked or canned, drained and rinsed beans or lentils.  cup or 3 oz (85 g) starchy vegetable, such as peas, corn, or squash.  cup or 4 oz (120 g) hot cereal.  cup or 3 oz (85 g) boiled or mashed potatoes, or  or 3 oz (85 g) of a large baked potato.  cup or 4 fl oz (118 mL) fruit juice. 1 cup or 8 fl oz (237 mL) milk. 1 small or 4 oz (106 g) apple.  or 2 oz (63 g) of a medium banana. 1 cup or 5 oz (150 g) strawberries. 3 cups or 1 oz (24 g) popped popcorn. What is an example of carbohydrate counting? To calculate the number of carbohydrates in this sample meal, follow the steps shown below. Sample meal 3 oz (85 g) chicken breast. ? cup or 4 oz (106 g) brown   rice.  cup or 3 oz (85 g) corn. 1 cup or 8 fl oz (237 mL) milk. 1 cup or 5 oz (150 g) strawberries with sugar-free whipped topping. Carbohydrate calculation Identify the foods that contain carbohydrates: Rice. Corn. Milk. Strawberries. Calculate how many servings you have of each food: 2 servings rice. 1 serving corn. 1 serving milk. 1 serving strawberries. Multiply each number of servings by 15 g: 2 servings rice x 15 g = 30 g. 1 serving corn x 15 g = 15 g. 1 serving milk x 15 g = 15 g. 1 serving strawberries x 15 g = 15  g. Add together all of the amounts to find the total grams of carbohydrates eaten: 30 g + 15 g + 15 g + 15 g = 75 g of carbohydrates total. What are tips for following this plan? Shopping Develop a meal plan and then make a shopping list. Buy fresh and frozen vegetables, fresh and frozen fruit, dairy, eggs, beans, lentils, and whole grains. Look at food labels. Choose foods that have more fiber and less sugar. Avoid processed foods and foods with added sugars. Meal planning Aim to have the same amount of carbohydrates at each meal and for each snack time. Plan to have regular, balanced meals and snacks. Where to find more information American Diabetes Association: www.diabetes.org Centers for Disease Control and Prevention: www.cdc.gov Summary Carbohydrate counting is a method of keeping track of how many carbohydrates you eat. Eating carbohydrates naturally increases the amount of sugar (glucose) in the blood. Counting how many carbohydrates you eat improves your blood glucose control, which helps you manage your diabetes. A dietitian can help you make a meal plan and calculate how many carbohydrates you should have at each meal and snack. This information is not intended to replace advice given to you by your health care provider. Make sure you discuss any questions you have with your health care provider. Document Revised: 07/03/2019 Document Reviewed: 07/04/2019 Elsevier Patient Education  2021 Elsevier Inc.  

## 2021-05-17 NOTE — Progress Notes (Signed)
Subjective:    Patient ID: Lindsey Phillips, female    DOB: 26-Apr-1951, 70 y.o.   MRN: [id number]   Chief Complaint: medical management of chronic issues     HPI:  1. Hyperlipidemia with target LDL less than 100 Does try to watch diet but does little to no exercise. Lab Results  Component Value Date   CHOL 180 12/30/2020   HDL 31 (L) 12/30/2020   LDLCALC 122 (H) 12/30/2020   TRIG 149 12/30/2020   CHOLHDL 5.8 (H) 12/30/2020     2. Type 2 diabetes mellitus without complication, without long-term current use of insulin (HCC) New diabetic. Her hgba1c at urgent care was 12( 1 month ago). She has been checking her blood sugars at home. They have been running 80-130. She had diarrhea when she first started taking metformin but that has resolved.   3. Vitamin D deficiency Is on daily vitamin d supplement  4. BMI 28.0-28.9,adult No recent weight changes  Wt Readings from Last 3 Encounters:  05/17/21 200 lb (90.7 kg)  04/20/21 200 lb (90.7 kg)  12/30/20 206 lb (93.4 kg)   BMI Readings from Last 3 Encounters:  05/17/21 30.41 kg/m  04/20/21 30.41 kg/m  12/30/20 31.32 kg/m      Outpatient Encounter Medications as of 05/17/2021  Medication Sig   atorvastatin (LIPITOR) 40 MG tablet Take 1 tablet (40 mg total) by mouth daily.   blood glucose meter kit and supplies Dispense based on patient and insurance preference. Use up to four times daily as directed. (FOR ICD-10 E10.9, E11.9).   calcium carbonate (OS-CAL) 1250 (500 Ca) MG chewable tablet Chew 1 tablet by mouth daily.   Cholecalciferol (VITAMIN D3) 1000 units CAPS Take 1 capsule by mouth daily.   glucose blood (ACCU-CHEK GUIDE) test strip Test BS QID Dx E11.65   metFORMIN (GLUCOPHAGE) 1000 MG tablet Take 1 tablet (1,000 mg total) by mouth 2 (two) times daily with a meal.   prednisoLONE acetate (PRED FORTE) 1 % ophthalmic suspension 1 drop 4 (four) times daily.   Facility-Administered Encounter Medications as of 05/17/2021   Medication   0.9 %  sodium chloride infusion    Past Surgical History:  Procedure Laterality Date   ABDOMINAL HYSTERECTOMY     CATARACT EXTRACTION  03/2021   KNEE ARTHROSCOPY Right 07/03/2016   Procedure: RIGHT KNEE ARTHROSCOPY;  Surgeon: Carole Civil, MD;  Location: [facility];  Service: Orthopedics;  Laterality: Right;    Family History  Problem Relation Age of Onset   Hypertension Mother    Diabetes Mother    Heart disease Mother    Kidney disease Mother    Diabetes Father    Hypertension Sister    Diabetes Brother    Hypertension Brother    Hypertension Sister    Diabetes Brother    Hypertension Brother    Diabetes Brother    Hypertension Brother    Diabetes Brother    Kidney disease Brother    Colon cancer Neg Hx     New complaints: None today  Social history: Lives by herself. Family lives next door.  Controlled substance contract: n/a      Review of Systems  Constitutional:  Negative for diaphoresis.  Eyes:  Negative for pain.  Respiratory:  Negative for shortness of breath.   Cardiovascular:  Negative for chest pain, palpitations and leg swelling.  Gastrointestinal:  Negative for abdominal pain.  Endocrine: Negative for polydipsia.  Skin:  Negative for rash.  Neurological:  Negative  for dizziness, weakness and headaches.  Hematological:  Does not bruise/bleed easily.  All other systems reviewed and are negative.     Objective:   Physical Exam Vitals and nursing note reviewed.  Constitutional:      General: She is not in acute distress.    Appearance: Normal appearance. She is well-developed.  HENT:     Head: Normocephalic.     Right Ear: Tympanic membrane normal.     Left Ear: Tympanic membrane normal.     Nose: Nose normal.     Mouth/Throat:     Mouth: Mucous membranes are moist.  Eyes:     Pupils: Pupils are equal, round, and reactive to light.  Neck:     Vascular: No carotid bruit or JVD.  Cardiovascular:     Rate and Rhythm:  Normal rate and regular rhythm.     Heart sounds: Normal heart sounds.  Pulmonary:     Effort: Pulmonary effort is normal. No respiratory distress.     Breath sounds: Normal breath sounds. No wheezing or rales.  Chest:     Chest wall: No tenderness.  Abdominal:     General: Bowel sounds are normal. There is no distension or abdominal bruit.     Palpations: Abdomen is soft. There is no hepatomegaly, splenomegaly, mass or pulsatile mass.     Tenderness: There is no abdominal tenderness.  Musculoskeletal:        General: Normal range of motion.     Cervical back: Normal range of motion and neck supple.  Lymphadenopathy:     Cervical: No cervical adenopathy.  Skin:    General: Skin is warm and dry.  Neurological:     Mental Status: She is alert and oriented to person, place, and time.     Deep Tendon Reflexes: Reflexes are normal and symmetric.  Psychiatric:        Behavior: Behavior normal.        Thought Content: Thought content normal.        Judgment: Judgment normal.   BP 124/77   Pulse 98   Temp (!) 97 F (36.1 C) (Temporal)   Resp 20   Ht 5' 8"  (1.727 m)   Wt 200 lb (90.7 kg)   SpO2 98%   BMI 30.41 kg/m   Hgba1c 9.0       Assessment & Plan:  Lindsey Phillips comes in today with chief complaint of Diabetes   Diagnosis and orders addressed:  1. Hyperlipidemia with target LDL less than 100 Low fat diet  2. Type 2 diabetes mellitus without complication, without long-term current use of insulin (HCC) Continue to watch carbs in diet Check blood sugars fasting in mornings - Bayer DCA Hb A1c Waived  3. Vitamin D deficiency Continue vitamin d supplement  4. BMI 28.0-28.9,adult Discussed diet and exercise for person with BMI >25 Will recheck weight in 3-6 months   Labs pending Health Maintenance reviewed Diet and exercise encouraged  Follow up plan: 3 months   Mary-Margaret Hassell Done, FNP

## 2021-05-23 ENCOUNTER — Ambulatory Visit
Admission: RE | Admit: 2021-05-23 | Discharge: 2021-05-23 | Disposition: A | Discharge status: Home / Self Care | Payer: Medicare Other | Source: Ambulatory Visit | Attending: Nurse Practitioner | Admitting: Nurse Practitioner

## 2021-05-23 ENCOUNTER — Other Ambulatory Visit: Payer: Self-pay

## 2021-05-23 DIAGNOSIS — Z1231 Encounter for screening mammogram for malignant neoplasm of breast: Secondary | ICD-10-CM

## 2021-05-26 DIAGNOSIS — H35342 Macular cyst, hole, or pseudohole, left eye: Secondary | ICD-10-CM | POA: Diagnosis not present

## 2021-05-26 DIAGNOSIS — H43821 Vitreomacular adhesion, right eye: Secondary | ICD-10-CM | POA: Diagnosis not present

## 2021-05-30 ENCOUNTER — Ambulatory Visit (INDEPENDENT_AMBULATORY_CARE_PROVIDER_SITE_OTHER): Payer: Medicare Other

## 2021-05-30 VITALS — Ht 68.0 in | Wt 200.0 lb

## 2021-05-30 DIAGNOSIS — Z0000 Encounter for general adult medical examination without abnormal findings: Secondary | ICD-10-CM

## 2021-05-30 NOTE — Patient Instructions (Signed)
Lindsey Phillips , Thank you for taking time to come for your Medicare Wellness Visit. I appreciate your ongoing commitment to your health goals. Please review the following plan we discussed and let me know if I can assist you in the future.   Screening recommendations/referrals: Colonoscopy: Done 03/14/2017 - Repeat in 10 years  Mammogram: Done 05/23/2021 - Repeat annually  Bone Density: Done 12/27/2016 - Repeat every 2 years  Recommended yearly ophthalmology/optometry visit for glaucoma screening and checkup Recommended yearly dental visit for hygiene and checkup  Vaccinations: Influenza vaccine: Done 04/30/2021 - Repeat annually  Pneumococcal vaccine: Done 5/4/207 & 6/13/208  Tdap vaccine: Due Shingles vaccine: Due   Covid-19:Done 08/26/2019, 09/23/2019, 12/17/2019, 05/11/2020, & 12/16/2020  Advanced directives: Advance directive discussed with you today. Even though you declined this today, please call our office should you change your mind, and we can give you the proper paperwork for you to fill out.   Conditions/risks identified: Aim for 30 minutes of exercise or brisk walking each day, drink 6-8 glasses of water and eat lots of fruits and vegetables.   Next appointment: Follow up in one year for your annual wellness visit    Preventive Care 65 Years and Older, Female Preventive care refers to lifestyle choices and visits with your health care provider that can promote health and wellness. What does preventive care include? A yearly physical exam. This is also called an annual well check. Dental exams once or twice a year. Routine eye exams. Ask your health care provider how often you should have your eyes checked. Personal lifestyle choices, including: Daily care of your teeth and gums. Regular physical activity. Eating a healthy diet. Avoiding tobacco and drug use. Limiting alcohol use. Practicing safe sex. Taking low-dose aspirin every day. Taking vitamin and mineral supplements as  recommended by your health care provider. What happens during an annual well check? The services and screenings done by your health care provider during your annual well check will depend on your age, overall health, lifestyle risk factors, and family history of disease. Counseling  Your health care provider may ask you questions about your: Alcohol use. Tobacco use. Drug use. Emotional well-being. Home and relationship well-being. Sexual activity. Eating habits. History of falls. Memory and ability to understand (cognition). Work and work Astronomer. Reproductive health. Screening  You may have the following tests or measurements: Height, weight, and BMI. Blood pressure. Lipid and cholesterol levels. These may be checked every 5 years, or more frequently if you are over 31 years old. Skin check. Lung cancer screening. You may have this screening every year starting at age 21 if you have a 30-pack-year history of smoking and currently smoke or have quit within the past 15 years. Fecal occult blood test (FOBT) of the stool. You may have this test every year starting at age 82. Flexible sigmoidoscopy or colonoscopy. You may have a sigmoidoscopy every 5 years or a colonoscopy every 10 years starting at age 82. Hepatitis C blood test. Hepatitis B blood test. Sexually transmitted disease (STD) testing. Diabetes screening. This is done by checking your blood sugar (glucose) after you have not eaten for a while (fasting). You may have this done every 1-3 years. Bone density scan. This is done to screen for osteoporosis. You may have this done starting at age 48. Mammogram. This may be done every 1-2 years. Talk to your health care provider about how often you should have regular mammograms. Talk with your health care provider about your test  results, treatment options, and if necessary, the need for more tests. Vaccines  Your health care provider may recommend certain vaccines, such  as: Influenza vaccine. This is recommended every year. Tetanus, diphtheria, and acellular pertussis (Tdap, Td) vaccine. You may need a Td booster every 10 years. Zoster vaccine. You may need this after age 42. Pneumococcal 13-valent conjugate (PCV13) vaccine. One dose is recommended after age 2. Pneumococcal polysaccharide (PPSV23) vaccine. One dose is recommended after age 38. Talk to your health care provider about which screenings and vaccines you need and how often you need them. This information is not intended to replace advice given to you by your health care provider. Make sure you discuss any questions you have with your health care provider. Document Released: 07/30/2015 Document Revised: 03/22/2016 Document Reviewed: 05/04/2015 Elsevier Interactive Patient Education  2017 [place] Prevention in the Home Falls can cause injuries. They can happen to people of all ages. There are many things you can do to make your home safe and to help prevent falls. What can I do on the outside of my home? Regularly fix the edges of walkways and driveways and fix any cracks. Remove anything that might make you trip as you walk through a door, such as a raised step or threshold. Trim any bushes or trees on the path to your home. Use bright outdoor lighting. Clear any walking paths of anything that might make someone trip, such as rocks or tools. Regularly check to see if handrails are loose or broken. Make sure that both sides of any steps have handrails. Any raised decks and porches should have guardrails on the edges. Have any leaves, snow, or ice cleared regularly. Use sand or salt on walking paths during winter. Clean up any spills in your garage right away. This includes oil or grease spills. What can I do in the bathroom? Use night lights. Install grab bars by the toilet and in the tub and shower. Do not use towel bars as grab bars. Use non-skid mats or decals in the tub or  shower. If you need to sit down in the shower, use a plastic, non-slip stool. Keep the floor dry. Clean up any water that spills on the floor as soon as it happens. Remove soap buildup in the tub or shower regularly. Attach bath mats securely with double-sided non-slip rug tape. Do not have throw rugs and other things on the floor that can make you trip. What can I do in the bedroom? Use night lights. Make sure that you have a light by your bed that is easy to reach. Do not use any sheets or blankets that are too big for your bed. They should not hang down onto the floor. Have a firm chair that has side arms. You can use this for support while you get dressed. Do not have throw rugs and other things on the floor that can make you trip. What can I do in the kitchen? Clean up any spills right away. Avoid walking on wet floors. Keep items that you use a lot in easy-to-reach places. If you need to reach something above you, use a strong step stool that has a grab bar. Keep electrical cords out of the way. Do not use floor polish or wax that makes floors slippery. If you must use wax, use non-skid floor wax. Do not have throw rugs and other things on the floor that can make you trip. What can I do with my stairs?  Do not leave any items on the stairs. Make sure that there are handrails on both sides of the stairs and use them. Fix handrails that are broken or loose. Make sure that handrails are as long as the stairways. Check any carpeting to make sure that it is firmly attached to the stairs. Fix any carpet that is loose or worn. Avoid having throw rugs at the top or bottom of the stairs. If you do have throw rugs, attach them to the floor with carpet tape. Make sure that you have a light switch at the top of the stairs and the bottom of the stairs. If you do not have them, ask someone to add them for you. What else can I do to help prevent falls? Wear shoes that: Do not have high heels. Have  rubber bottoms. Are comfortable and fit you well. Are closed at the toe. Do not wear sandals. If you use a stepladder: Make sure that it is fully opened. Do not climb a closed stepladder. Make sure that both sides of the stepladder are locked into place. Ask someone to hold it for you, if possible. Clearly mark and make sure that you can see: Any grab bars or handrails. First and last steps. Where the edge of each step is. Use tools that help you move around (mobility aids) if they are needed. These include: Canes. Walkers. Scooters. Crutches. Turn on the lights when you go into a dark area. Replace any light bulbs as soon as they burn out. Set up your furniture so you have a clear path. Avoid moving your furniture around. If any of your floors are uneven, fix them. If there are any pets around you, be aware of where they are. Review your medicines with your doctor. Some medicines can make you feel dizzy. This can increase your chance of falling. Ask your doctor what other things that you can do to help prevent falls. This information is not intended to replace advice given to you by your health care provider. Make sure you discuss any questions you have with your health care provider. Document Released: 04/29/2009 Document Revised: 12/09/2015 Document Reviewed: 08/07/2014 Elsevier Interactive Patient Education  2017 [employer]   Diabetes Mellitus and Nutrition, Adult When you have diabetes, or diabetes mellitus, it is very important to have healthy eating habits because your blood sugar (glucose) levels are greatly affected by what you eat and drink. Eating healthy foods in the right amounts, at about the same times every day, can help you: Manage your blood glucose. Lower your risk of heart disease. Improve your blood pressure. Reach or maintain a healthy weight. What can affect my meal plan? Every person with diabetes is different, and each person has different needs for a  meal plan. Your health care provider may recommend that you work with a dietitian to make a meal plan that is best for you. Your meal plan may vary depending on factors such as: The calories you need. The medicines you take. Your weight. Your blood glucose, blood pressure, and cholesterol levels. Your activity level. Other health conditions you have, such as heart or kidney disease. How do carbohydrates affect me? Carbohydrates, also called carbs, affect your blood glucose level more than any other type of food. Eating carbs raises the amount of glucose in your blood. It is important to know how many carbs you can safely have in each meal. This is different for every person. Your dietitian can help you calculate how many  carbs you should have at each meal and for each snack. How does alcohol affect me? Alcohol can cause a decrease in blood glucose (hypoglycemia), especially if you use insulin or take certain diabetes medicines by mouth. Hypoglycemia can be a life-threatening condition. Symptoms of hypoglycemia, such as sleepiness, dizziness, and confusion, are similar to symptoms of having too much alcohol. Do not drink alcohol if: Your health care provider tells you not to drink. You are pregnant, may be pregnant, or are planning to become pregnant. If you drink alcohol: Limit how much you have to: 0-1 drink a day for women. 0-2 drinks a day for men. Know how much alcohol is in your drink. In the [place] one drink equals one 12 oz bottle of beer (355 mL), one 5 oz glass of wine (148 mL), or one 1 oz glass of hard liquor (44 mL). Keep yourself hydrated with water, diet soda, or unsweetened iced tea. Keep in mind that regular soda, juice, and other mixers may contain a lot of sugar and must be counted as carbs. What are tips for following this plan? Reading food labels Start by checking the serving size on the Nutrition Facts label of packaged foods and drinks. The number of calories and the  amount of carbs, fats, and other nutrients listed on the label are based on one serving of the item. Many items contain more than one serving per package. Check the total grams (g) of carbs in one serving. Check the number of grams of saturated fats and trans fats in one serving. Choose foods that have a low amount or none of these fats. Check the number of milligrams (mg) of salt (sodium) in one serving. Most people should limit total sodium intake to less than 2,300 mg per day. Always check the nutrition information of foods labeled as "low-fat" or "nonfat." These foods may be higher in added sugar or refined carbs and should be avoided. Talk to your dietitian to identify your daily goals for nutrients listed on the label. Shopping Avoid buying canned, pre-made, or processed foods. These foods tend to be high in fat, sodium, and added sugar. Shop around the outside edge of the grocery store. This is where you will most often find fresh fruits and vegetables, bulk grains, fresh meats, and fresh dairy products. Cooking Use low-heat cooking methods, such as baking, instead of high-heat cooking methods, such as deep frying. Cook using healthy oils, such as olive, canola, or sunflower oil. Avoid cooking with butter, cream, or high-fat meats. Meal planning Eat meals and snacks regularly, preferably at the same times every day. Avoid going long periods of time without eating. Eat foods that are high in fiber, such as fresh fruits, vegetables, beans, and whole grains. Eat 4-6 oz (112-168 g) of lean protein each day, such as lean meat, chicken, fish, eggs, or tofu. One ounce (oz) (28 g) of lean protein is equal to: 1 oz (28 g) of meat, chicken, or fish. 1 egg.  cup (62 g) of tofu. Eat some foods each day that contain healthy fats, such as avocado, nuts, seeds, and fish. What foods should I eat? Fruits Berries. Apples. Oranges. Peaches. Apricots. Plums. Grapes. Mangoes. Papayas. Pomegranates. Kiwi.  Cherries. Vegetables Leafy greens, including lettuce, spinach, kale, chard, collard greens, mustard greens, and cabbage. Beets. Cauliflower. Broccoli. Carrots. Green beans. Tomatoes. Peppers. Onions. Cucumbers. Brussels sprouts. Grains Whole grains, such as whole-wheat or whole-grain bread, crackers, tortillas, cereal, and pasta. Unsweetened oatmeal. Quinoa. Brown or wild rice. Meats  and other proteins Seafood. Poultry without skin. Lean cuts of poultry and beef. Tofu. Nuts. Seeds. Dairy Low-fat or fat-free dairy products such as milk, yogurt, and cheese. The items listed above may not be a complete list of foods and beverages you can eat and drink. Contact a dietitian for more information. What foods should I avoid? Fruits Fruits canned with syrup. Vegetables Canned vegetables. Frozen vegetables with butter or cream sauce. Grains Refined white flour and flour products such as bread, pasta, snack foods, and cereals. Avoid all processed foods. Meats and other proteins Fatty cuts of meat. Poultry with skin. Breaded or fried meats. Processed meat. Avoid saturated fats. Dairy Full-fat yogurt, cheese, or milk. Beverages Sweetened drinks, such as soda or iced tea. The items listed above may not be a complete list of foods and beverages you should avoid. Contact a dietitian for more information. Questions to ask a health care provider Do I need to meet with a certified diabetes care and education specialist? Do I need to meet with a dietitian? What number can I call if I have questions? When are the best times to check my blood glucose? Where to find more information: American Diabetes Association: diabetes.org Academy of Nutrition and Dietetics: eatright[employer] of Diabetes and Digestive and Kidney Diseases: [employer]Sync[employer]si Association of Diabetes Care & Education Specialists: diabeteseducator.org Summary It is important to have healthy eating habits because your blood sugar  (glucose) levels are greatly affected by what you eat and drink. It is important to use alcohol carefully. A healthy meal plan will help you manage your blood glucose and lower your risk of heart disease. Your health care provider may recommend that you work with a dietitian to make a meal plan that is best for you. This information is not intended to replace advice given to you by your health care provider. Make sure you discuss any questions you have with your health care provider. Document Revised: 02/04/2020 Document Reviewed: 02/04/2020 Elsevier Patient Education  2022 [employer].

## 2021-05-30 NOTE — Progress Notes (Signed)
Subjective:   Lindsey Phillips is a 70 y.o. female who presents for Medicare Annual (Subsequent) preventive examination.  Virtual Visit via Telephone Note  I connected with  Lindsey Phillips on 05/30/21 at 10:30 AM EST by telephone and verified that I am speaking with the correct person using two identifiers.  Location: Patient: Home Provider: [facility] Persons participating in the virtual visit: patient/Nurse Health Advisor   I discussed the limitations, risks, security and privacy concerns of performing an evaluation and management service by telephone and the availability of in person appointments. The patient expressed understanding and agreed to proceed.  Interactive audio and video telecommunications were attempted between this nurse and patient, however failed, due to patient having technical difficulties OR patient did not have access to video capability.  We continued and completed visit with audio only.  Some vital signs may be absent or patient reported.   Leelynn Whetsel E Earnstine Meinders, LPN   Review of Systems     Cardiac Risk Factors include: advanced age (>55mn, >>77women);diabetes mellitus;dyslipidemia;obesity (BMI >30kg/m2);sedentary lifestyle     Objective:    Today's Vitals   05/30/21 1033  Weight: 200 lb (90.7 kg)  Height: _0  (1.727 m)   Body mass index is 30.41 kg/m.  Advanced Directives 05/30/2021 05/27/2020 05/27/2019 05/14/2018 02/28/2017 12/27/2016 07/03/2016  Does Patient Have a Medical Advance Directive? _1  No No  Would patient like information on creating a medical advance directive? No - Patient declined No - Patient declined No - Patient declined No - Patient declined - Yes (ED - Information included in AVS) -    Current Medications (verified) Outpatient Encounter Medications as of 05/30/2021  Medication Sig   atorvastatin (LIPITOR) 40 MG tablet Take 1 tablet (40 mg total) by mouth daily.   calcium carbonate (OS-CAL) 1250 (500 Ca) MG chewable tablet Chew 1  tablet by mouth daily.   Cholecalciferol (VITAMIN D3) 1000 units CAPS Take 1 capsule by mouth daily.   metFORMIN (GLUCOPHAGE) 1000 MG tablet Take 1 tablet (1,000 mg total) by mouth 2 (two) times daily with a meal.   [DISCONTINUED] glucose blood (ACCU-CHEK GUIDE) test strip Test BS QID Dx E11.65   Accu-Chek Softclix Lancets lancets SMARTSIG:Topical 1-4 Times Daily   blood glucose meter kit and supplies Dispense based on patient and insurance preference. Use up to four times daily as directed. (FOR ICD-10 E10.9, E11.9).   [DISCONTINUED] gatifloxacin (ZYMAXID) 0.5 % SOLN Place 1 drop into the left eye 4 (four) times daily.   [DISCONTINUED] ketorolac (ACULAR) 0.5 % ophthalmic solution Place 1 drop into the left eye 4 (four) times daily.   [DISCONTINUED] prednisoLONE acetate (PRED FORTE) 1 % ophthalmic suspension 1 drop 4 (four) times daily.   Facility-Administered Encounter Medications as of 05/30/2021  Medication   0.9 %  sodium chloride infusion    Allergies (verified) Patient has no known allergies.   History: Past Medical History:  Diagnosis Date   Arthritis    Hyperlipidemia    Vitamin D deficiency    Past Surgical History:  Procedure Laterality Date   ABDOMINAL HYSTERECTOMY     CATARACT EXTRACTION  03/2021   KNEE ARTHROSCOPY Right 07/03/2016   Procedure: RIGHT KNEE ARTHROSCOPY;  Surgeon: SCarole Civil MD;  Location: [facility];  Service: Orthopedics;  Laterality: Right;   Family History  Problem Relation Age of Onset   Hypertension Mother    Diabetes Mother    Heart disease Mother    Kidney disease Mother  Diabetes Father    Hypertension Sister    Diabetes Brother    Hypertension Brother    Hypertension Sister    Diabetes Brother    Hypertension Brother    Diabetes Brother    Hypertension Brother    Diabetes Brother    Kidney disease Brother    Colon cancer Neg Hx    Social History   Socioeconomic History   Marital status: Divorced    Spouse name: Not on  file   Number of children: 1   Years of education: 11   Highest education level: 11th grade  Occupational History   Occupation: retired  Tobacco Use   Smoking status: Former    Packs/day: 0.25    Years: 15.00    Pack years: 3.75    Types: Cigarettes    Quit date: 07/17/2013    Years since quitting: 7.8   Smokeless tobacco: Never  Vaping Use   Vaping Use: Never used  Substance and Sexual Activity   Alcohol use: No   Drug use: No   Sexual activity: Not Currently    Birth control/protection: Surgical  Other Topics Concern   Not on file  Social History Narrative   Lives alone. Sister next door   Visits her aunt in nursing home twice per week   Daughter in [place] Strain: Low Risk    Difficulty of Paying Living Expenses: Not hard at all  Food Insecurity: No Food Insecurity   Worried About Charity fundraiser in the Last Year: Never true   Arboriculturist in the Last Year: Never true  Transportation Needs: No Transportation Needs   Lack of Transportation (Medical): No   Lack of Transportation (Non-Medical): No  Physical Activity: Insufficiently Active   Days of Exercise per Week: 3 days   Minutes of Exercise per Session: 30 min  Stress: No Stress Concern Present   Feeling of Stress : Not at all  Social Connections: Moderately Integrated   Frequency of Communication with Friends and Family: More than three times a week   Frequency of Social Gatherings with Friends and Family: More than three times a week   Attends Religious Services: More than 4 times per year   Active Member of [employer] or Organizations: Yes   Attends Music therapist: More than 4 times per year   Marital Status: Separated    Tobacco Counseling Counseling given: Not Answered   Clinical Intake:  Pre-visit preparation completed: Yes  Pain : No/denies pain     BMI - recorded: 30.41 Nutritional Status: BMI > 30  Obese Nutritional  Risks: None Diabetes: Yes CBG done?: No Did pt. bring in CBG monitor from home?: No  How often do you need to have someone help you when you read instructions, pamphlets, or other written materials from your doctor or pharmacy?: 1 - Never  Diabetic? Yes Nutrition Risk Assessment:  Has the patient had any N/V/D within the last 2 months?  No  Does the patient have any non-healing wounds?  No  Has the patient had any unintentional weight loss or weight gain?  No   Diabetes:  Is the patient diabetic?  Yes  If diabetic, was a CBG obtained today?  No  Did the patient bring in their glucometer from home?  No  How often do you monitor your CBG's? Once daily fasting - 114 this am per patient.   Financial Strains and Diabetes  Management:  Are you having any financial strains with the device, your supplies or your medication? No .  Does the patient want to be seen by Chronic Care Management for management of their diabetes?  No  Would the patient like to be referred to a Nutritionist or for Diabetic Management?  No   Diabetic Exams:  Diabetic Eye Exam: Completed 11/2020. .  Diabetic Foot Exam: Completed 05/17/2021. Pt has been advised about the importance in completing this exam. Pt is scheduled for diabetic foot exam on next year.    Interpreter Needed?: No  Information entered by :: Shaeley Segall, LPN   Activities of Daily Living In your present state of health, do you have any difficulty performing the following activities: 05/30/2021  Hearing? N  Vision? N  Difficulty concentrating or making decisions? N  Walking or climbing stairs? N  Dressing or bathing? N  Doing errands, shopping? N  Preparing Food and eating ? N  Using the Toilet? N  In the past six months, have you accidently leaked urine? N  Do you have problems with loss of bowel control? N  Managing your Medications? N  Managing your Finances? N  Housekeeping or managing your Housekeeping? N  Some recent data might  be hidden    Patient Care Team: Chevis Pretty, FNP as PCP - General (Nurse Practitioner) Carole Civil, MD as Consulting Physician (Orthopedic Surgery)  Indicate any recent Medical Services you may have received from other than [facility] providers in the past year (date may be approximate).     Assessment:   This is a routine wellness examination for Lindsey Phillips.  Hearing/Vision screen Hearing Screening - Comments:: Denies hearing difficulties  Vision Screening - Comments:: Wears rx glasses - up to date with annual eye exams with [facility]  Dietary issues and exercise activities discussed: Current Exercise Habits: Home exercise routine, Type of exercise: walking, Time (Minutes): 30, Frequency (Times/Week): 3, Weekly Exercise (Minutes/Week): 90, Intensity: Mild, Exercise limited by: None identified   Goals Addressed             This Visit's Progress    DIET - INCREASE WATER INTAKE   On track    Try to drink 6-8 glasses of water daily     Exercise 3x per week (30 min per time)   On track    Walk 3 times per week for 30 minutes each session.     Increase lean proteins   On track    Decrease intake of processed meat like bacon and sausage - increase intake of lean protein such as [place], chicken, fish- broiled, grilled, baked.  Avoid frying foods.       Depression Screen PHQ 2/9 Scores 05/30/2021 05/17/2021 04/20/2021 12/30/2020 07/01/2020 05/27/2020 05/27/2020  PHQ - 2 Score 0 0 0 0 0 0 0  PHQ- 9 Score 0 1 0 1 - - -    Fall Risk Fall Risk  05/30/2021 05/17/2021 04/20/2021 12/30/2020 07/01/2020  Falls in the past year? 0 0 0 0 0  Number falls in past yr: 0 - - - -  Injury with Fall? 0 - - - -  Risk for fall due to : Orthopedic patient - - - -  Follow up Falls prevention discussed - - - -    FALL Roxie:  Any stairs in or around the home? No  If so, are there any without handrails? No  Home free of loose throw rugs in walkways,  pet beds, electrical cords, etc? Yes  Adequate lighting in your home to reduce risk of falls? Yes   ASSISTIVE DEVICES UTILIZED TO PREVENT FALLS:  Life alert? No  Use of a cane, walker or w/c? No  Grab bars in the bathroom? No  Shower chair or bench in shower? No  Elevated toilet seat or a handicapped toilet? No   TIMED UP AND GO:  Was the test performed? No . Telephonic visit.  Cognitive Function: Normal cognitive status assessed by direct observation by this Nurse Health Advisor. No abnormalities found.   MMSE - Mini Mental State Exam 05/14/2018 12/27/2016  Orientation to time 5 5  Orientation to Place 5 5  Registration 3 3  Attention/ Calculation 5 2  Recall 2 0  Language- name 2 objects 2 2  Language- repeat 1 1  Language- follow 3 step command 3 3  Language- read & follow direction 1 1  Write a sentence 1 1  Copy design 1 0  Total score 29 23     6CIT Screen 05/30/2021 05/27/2020 05/27/2019  What Year? 0 points 0 points 0 points  What month? 0 points 0 points 0 points  What time? 0 points 0 points 0 points  Count back from 20 0 points 0 points 0 points  Months in reverse 0 points 0 points 0 points  Repeat phrase 2 points 0 points 4 points  Total Score 2 0 4    Immunizations Immunization History  Administered Date(s) Administered   Fluad Quad(high Dose 65+) 04/30/2021   Influenza, High Dose Seasonal PF 05/07/2017, 04/25/2020   Influenza, Quadrivalent, Recombinant, Inj, Pf 04/21/2019   Influenza,inj,Quad PF,6+ Mos 04/16/2018   Influenza-Unspecified 04/16/2016, 05/07/2017, 04/25/2020   Moderna SARS-COV2 Booster Vaccination 12/16/2020   Moderna Sars-Covid-2 Vaccination 08/26/2019, 09/23/2019, 12/17/2019, 05/11/2020   Pneumococcal Conjugate-13 11/18/2015   Pneumococcal Polysaccharide-23 12/27/2016    TDAP status: Due, Education has been provided regarding the importance of this vaccine. Advised may receive this vaccine at local pharmacy or Health Dept. Aware to  provide a copy of the vaccination record if obtained from local pharmacy or Health Dept. Verbalized acceptance and understanding.  Flu Vaccine status: Up to date  Pneumococcal vaccine status: Up to date  Covid-19 vaccine status: Completed vaccines  Qualifies for Shingles Vaccine? Yes   Zostavax completed No   Shingrix Completed?: No.    Education has been provided regarding the importance of this vaccine. Patient has been advised to call insurance company to determine out of pocket expense if they have not yet received this vaccine. Advised may also receive vaccine at local pharmacy or Health Dept. Verbalized acceptance and understanding.  Screening Tests Health Maintenance  Topic Date Due   OPHTHALMOLOGY EXAM  Never done   URINE MICROALBUMIN  Never done   DEXA SCAN  12/28/2018   COVID-19 Vaccine (5 - Booster for Moderna series) 06/02/2021 (Originally 02/10/2021)   TETANUS/TDAP  07/01/2021 (Originally 08/23/1969)   Zoster Vaccines- Shingrix (1 of 2) 08/17/2021 (Originally 08/23/2000)   HEMOGLOBIN A1C  11/14/2021   FOOT EXAM  05/17/2022   MAMMOGRAM  05/24/2023   COLONOSCOPY (Pts 45-19yr Insurance coverage will need to be confirmed)  03/15/2027   Pneumonia Vaccine 70 Years old  Completed   INFLUENZA VACCINE  Completed   Hepatitis C Screening  Completed   HPV VACCINES  Aged Out    Health Maintenance  Health Maintenance Due  Topic Date Due   OPHTHALMOLOGY EXAM  Never done   URINE MICROALBUMIN  Never  done   DEXA SCAN  12/28/2018    Colorectal cancer screening: Type of screening: Colonoscopy. Completed 03/14/2017. Repeat every 10 years  Mammogram status: Completed 05/23/2021. Repeat every year  Bone Density status: Completed 12/27/2016. Results reflect: Bone density results: OSTEOPENIA. Repeat every 2 years.  Lung Cancer Screening: (Low Dose CT Chest recommended if Age 61-80 years, 30 pack-year currently smoking OR have quit w/in 15years.) does not qualify.   Additional  Screening:  Hepatitis C Screening: does qualify; Completed 11/18/2015  Vision Screening: Recommended annual ophthalmology exams for early detection of glaucoma and other disorders of the eye. Is the patient up to date with their annual eye exam?  Yes  Who is the provider or what is the name of the office in which the patient attends annual eye exams? [place] If pt is not established with a provider, would they like to be referred to a provider to establish care? No .   Dental Screening: Recommended annual dental exams for proper oral hygiene  Community Resource Referral / Chronic Care Management: CRR required this visit?  No   CCM required this visit?  No      Plan:     I have personally reviewed and noted the following in the patient's chart:   Medical and social history Use of alcohol, tobacco or illicit drugs  Current medications and supplements including opioid prescriptions.  Functional ability and status Nutritional status Physical activity Advanced directives List of other physicians Hospitalizations, surgeries, and ER visits in previous 12 months Vitals Screenings to include cognitive, depression, and falls Referrals and appointments  In addition, I have reviewed and discussed with patient certain preventive protocols, quality metrics, and best practice recommendations. A written personalized care plan for preventive services as well as general preventive health recommendations were provided to patient.     Sandrea Hammond, LPN   [id number]   Nurse Notes: none

## 2021-07-01 ENCOUNTER — Ambulatory Visit: Payer: Self-pay | Admitting: Nurse Practitioner

## 2021-07-25 DIAGNOSIS — H2511 Age-related nuclear cataract, right eye: Secondary | ICD-10-CM | POA: Diagnosis not present

## 2021-07-25 DIAGNOSIS — E119 Type 2 diabetes mellitus without complications: Secondary | ICD-10-CM | POA: Diagnosis not present

## 2021-07-25 DIAGNOSIS — H35342 Macular cyst, hole, or pseudohole, left eye: Secondary | ICD-10-CM | POA: Diagnosis not present

## 2021-07-25 DIAGNOSIS — H43821 Vitreomacular adhesion, right eye: Secondary | ICD-10-CM | POA: Diagnosis not present

## 2021-08-18 ENCOUNTER — Encounter: Payer: Self-pay | Admitting: Nurse Practitioner

## 2021-08-18 ENCOUNTER — Ambulatory Visit (INDEPENDENT_AMBULATORY_CARE_PROVIDER_SITE_OTHER): Payer: Medicare Other | Admitting: Nurse Practitioner

## 2021-08-18 VITALS — BP 106/73 | HR 91 | Temp 96.3°F | Resp 20 | Ht 68.0 in | Wt 192.0 lb

## 2021-08-18 DIAGNOSIS — Z6829 Body mass index (BMI) 29.0-29.9, adult: Secondary | ICD-10-CM

## 2021-08-18 DIAGNOSIS — E1165 Type 2 diabetes mellitus with hyperglycemia: Secondary | ICD-10-CM | POA: Diagnosis not present

## 2021-08-18 DIAGNOSIS — E559 Vitamin D deficiency, unspecified: Secondary | ICD-10-CM

## 2021-08-18 DIAGNOSIS — E785 Hyperlipidemia, unspecified: Secondary | ICD-10-CM

## 2021-08-18 DIAGNOSIS — E119 Type 2 diabetes mellitus without complications: Secondary | ICD-10-CM | POA: Diagnosis not present

## 2021-08-18 LAB — CBC WITH DIFFERENTIAL/PLATELET
Basophils Absolute: 0 10*3/uL (ref 0.0–0.2)
Basos: 0 %
EOS (ABSOLUTE): 0.1 10*3/uL (ref 0.0–0.4)
Eos: 2 %
Hematocrit: 38.9 % (ref 34.0–46.6)
Hemoglobin: 12.2 g/dL (ref 11.1–15.9)
Immature Grans (Abs): 0 10*3/uL (ref 0.0–0.1)
Immature Granulocytes: 0 %
Lymphocytes Absolute: 2.2 10*3/uL (ref 0.7–3.1)
Lymphs: 40 %
MCH: 26 pg — ABNORMAL LOW (ref 26.6–33.0)
MCHC: 31.4 g/dL — ABNORMAL LOW (ref 31.5–35.7)
MCV: 83 fL (ref 79–97)
Monocytes Absolute: 0.3 10*3/uL (ref 0.1–0.9)
Monocytes: 6 %
Neutrophils Absolute: 2.9 10*3/uL (ref 1.4–7.0)
Neutrophils: 52 %
Platelets: 290 10*3/uL (ref 150–450)
RBC: 4.69 x10E6/uL (ref 3.77–5.28)
RDW: 13.6 % (ref 11.7–15.4)
WBC: 5.6 10*3/uL (ref 3.4–10.8)

## 2021-08-18 LAB — CMP14+EGFR
ALT: 12 IU/L (ref 0–32)
AST: 16 IU/L (ref 0–40)
Albumin/Globulin Ratio: 1.6 (ref 1.2–2.2)
Albumin: 4.5 g/dL (ref 3.8–4.8)
Alkaline Phosphatase: 120 IU/L (ref 44–121)
BUN/Creatinine Ratio: 21 (ref 12–28)
BUN: 20 mg/dL (ref 8–27)
Bilirubin Total: 0.3 mg/dL (ref 0.0–1.2)
CO2: 25 mmol/L (ref 20–29)
Calcium: 10.4 mg/dL — ABNORMAL HIGH (ref 8.7–10.3)
Chloride: 106 mmol/L (ref 96–106)
Creatinine, Ser: 0.96 mg/dL (ref 0.57–1.00)
Globulin, Total: 2.9 g/dL (ref 1.5–4.5)
Glucose: 97 mg/dL (ref 70–99)
Potassium: 4.7 mmol/L (ref 3.5–5.2)
Sodium: 142 mmol/L (ref 134–144)
Total Protein: 7.4 g/dL (ref 6.0–8.5)
eGFR: 64 mL/min/{1.73_m2} (ref >59)

## 2021-08-18 LAB — BAYER DCA HB A1C WAIVED: HB A1C (BAYER DCA - WAIVED): 6.1 % — ABNORMAL HIGH (ref 4.8–5.6)

## 2021-08-18 LAB — LIPID PANEL
Chol/HDL Ratio: 5.3 ratio — ABNORMAL HIGH (ref 0.0–4.4)
Cholesterol, Total: 176 mg/dL (ref 100–199)
HDL: 33 mg/dL — ABNORMAL LOW (ref >39)
LDL Chol Calc (NIH): 121 mg/dL — ABNORMAL HIGH (ref 0–99)
Triglycerides: 123 mg/dL (ref 0–149)
VLDL Cholesterol Cal: 22 mg/dL (ref 5–40)

## 2021-08-18 MED ORDER — ATORVASTATIN CALCIUM 40 MG PO TABS: 40.0000 mg | ORAL_TABLET | Freq: Every day | ORAL | 1 refills | Status: DC

## 2021-08-18 MED ORDER — METFORMIN HCL 1000 MG PO TABS: 1000.0000 mg | ORAL_TABLET | Freq: Two times a day (BID) | ORAL | 3 refills | Status: DC

## 2021-08-18 NOTE — Patient Instructions (Signed)
Bone Health ?Bones protect organs, store calcium, anchor muscles, and support the whole body. Keeping your bones strong is important, especially as you get older. You can take actions to help keep your bones strong and healthy. ?Why is keeping my bones healthy important? ?Keeping your bones healthy is important because your body constantly replaces bone cells. Cells get old, and new cells take their place. As we age, we lose bone cells because the body may not be able to make enough new cells to replace the old cells. The amount of bone cells and bone tissue you have is referred to as bone mass. The higher your bone mass, the stronger your bones. ?The aging process leads to an overall loss of bone mass in the body, which can increase the likelihood of: ?Broken bones. ?A condition in which the bones become weak and brittle (osteoporosis). ?A large decline in bone mass occurs in older adults. In women, it occurs about the time of menopause. ?What actions can I take to keep my bones healthy? ?Good health habits are important for maintaining healthy bones. This includes eating nutritious foods and exercising regularly. To have healthy bones, you need to get enough of the right minerals and vitamins. Most nutrition experts recommend getting these nutrients from the foods that you eat. In some cases, taking supplements may also be recommended. Doing certain types of exercise is also important for bone health. ?What are the nutritional recommendations for healthy bones? ?Eating a well-balanced diet with plenty of calcium and vitamin D will help to protect your bones. Nutritional recommendations vary from person to person. Ask your health care provider what is healthy for you. Here are some general guidelines. ?Get enough calcium ?Calcium is the most important (essential) mineral for bone health. Most people can get enough calcium from their diet, but supplements may be recommended for people who are at risk for  osteoporosis. Good sources of calcium include: ?Dairy products, such as low-fat or nonfat milk, cheese, and yogurt. ?Dark green leafy vegetables, such as bok choy and broccoli. ?Foods that have calcium added to them (are fortified). Foods that may be fortified with calcium include orange juice, cereal, bread, soy beverages, and tofu products. ?Nuts, such as almonds. ?Follow these recommended amounts for daily calcium intake: ?Infants, 0-6 months: 200 mg. ?Infants, 6-12 months: 260 mg. ?Children, age 1-3: 700 mg. ?Children, age 4-8: 1,000 mg. ?Children, age 9-13: 1,300 mg. ?Teens, age 14-18: 1,300 mg. ?Adults, age 19-50: 1,000 mg. ?Adults, age 51-70: ?Men: 1,000 mg. ?Women: 1,200 mg. ?Adults, age 71 or older: 1,200 mg. ?Pregnant and breastfeeding females: ?Teens: 1,300 mg. ?Adults: 1,000 mg. ?Get enough vitamin D ?Vitamin D is the most essential vitamin for bone health. It helps the body absorb calcium. Sunlight stimulates the skin to make vitamin D, so be sure to get enough sunlight. If you live in a cold climate or you do not get outside often, your health care provider may recommend that you take vitamin D supplements. Good sources of vitamin D in your diet include: ?Egg yolks. ?Saltwater fish. ?Milk and cereal fortified with vitamin D. ?Follow these recommended amounts for daily vitamin D intake: ?Infants, 0-12 months: 400 international units (IU). ?Children and teens, age 1-18: 600 international units. ?Adults, age 59 or younger: 600 international units. ?Adults, age 60 or older: 600-1,000 international units. ?Get other important nutrients ?Other nutrients that are important for bone health include: ?Phosphorus. This mineral is found in meat, poultry, dairy foods, nuts, and legumes. The recommended daily   intake for adult men and adult women is 700 mg. ?Magnesium. This mineral is found in seeds, nuts, dark green vegetables, and legumes. The recommended daily intake for adult men is 400-420 mg. For adult women,  it is 310-320 mg. ?Vitamin K. This vitamin is found in green leafy vegetables. The recommended daily intake is 120 mcg for adult men and 90 mcg for adult women. ?What type of physical activity is best for building and maintaining healthy bones? ?Weight-bearing and strength-building activities are important for building and maintaining healthy bones. Weight-bearing activities cause muscles and bones to work against gravity. Strength-building activities increase the strength of the muscles that support bones. Weight-bearing and muscle-building activities include: ?Walking and hiking. ?Jogging and running. ?Dancing. ?Gym exercises. ?Lifting weights. ?Tennis and racquetball. ?Climbing stairs. ?Aerobics. ?Adults should get at least 30 minutes of moderate physical activity on most days. Children should get at least 60 minutes of moderate physical activity on most days. Ask your health care provider what type of exercise is best for you. ?How can I find out if my bone mass is low? ?Bone mass can be measured with an X-ray test called a bone mineral density (BMD) test. This test is recommended for all women who are age 65 or older. It may also be recommended for: ?Men who are age 70 or older. ?People who are at risk for osteoporosis because of: ?Having a long-term disease that weakens bones, such as kidney disease or rheumatoid arthritis. ?Having menopause earlier than normal. ?Taking medicine that weakens bones, such as steroids, thyroid hormones, or hormone treatment for breast cancer or prostate cancer. ?Smoking. ?Drinking three or more alcoholic drinks a day. ?Being underweight. ?Sedentary lifestyle. ?If you find that you have a low bone mass, you may be able to prevent osteoporosis or further bone loss by changing your diet and lifestyle. ?Where can I find more information? ?Bone Health & Osteoporosis Foundation: www.nof.org/patients ?National Institutes of Health: www.bones.nih.gov ?International Osteoporosis  Foundation: www.iofbonehealth.org ?Summary ?The aging process leads to an overall loss of bone mass in the body, which can increase the likelihood of broken bones and osteoporosis. ?Eating a well-balanced diet with plenty of calcium and vitamin D will help to protect your bones. ?Weight-bearing and strength-building activities are also important for building and maintaining strong bones. ?Bone mass can be measured with an X-ray test called a bone mineral density (BMD) test. ?This information is not intended to replace advice given to you by your health care provider. Make sure you discuss any questions you have with your health care provider. ?Document Revised: 12/15/2020 Document Reviewed: 12/15/2020 ?Elsevier Patient Education ? 2022 [employer] ? ?

## 2021-08-18 NOTE — Progress Notes (Signed)
Subjective:    Patient ID: Lindsey Phillips, female    DOB: February 01, 1951, 71 y.o.   MRN: [id number]   Chief Complaint: medical management of chronic issues     HPI:  Lindsey Phillips is a 71 y.o. who identifies as a female who was assigned female at birth.   Social history: Lives with: lives by herself and family checks on her daily Work history: retired   Scientist, forensic in today for follow up of the following chronic medical issues:  1. Hyperlipidemia with target LDL less than 100 Does try to watch dit but does little to no dedicated exercise. Is on lipitor daily. Lab Results  Component Value Date   CHOL 180 12/30/2020   HDL 31 (L) 12/30/2020   LDLCALC 122 (H) 12/30/2020   TRIG 149 12/30/2020   CHOLHDL 5.8 (H) 12/30/2020     2. Type 2 diabetes mellitus without complication, without long-term current use of insulin (HCC) Her fasting blood sugars have been running 100-150 ost of the time. She will have an occasional reading around 180. No low blood sugars. Lab Results  Component Value Date   HGBA1C 9.0 (H) 05/17/2021     3. Vitamin D deficiency Is on daily vitamin d supplement  4. BMI 30.0-30.9,adult Weight is down 8  lbs Wt Readings from Last 3 Encounters:  08/18/21 192 lb (87.1 kg)  05/30/21 200 lb (90.7 kg)  05/17/21 200 lb (90.7 kg)   BMI Readings from Last 3 Encounters:  08/18/21 29.19 kg/m  05/30/21 30.41 kg/m  05/17/21 30.41 kg/m     New complaints: None today  No Known Allergies Outpatient Encounter Medications as of 08/18/2021  Medication Sig   Accu-Chek Softclix Lancets lancets SMARTSIG:Topical 1-4 Times Daily   atorvastatin (LIPITOR) 40 MG tablet Take 1 tablet (40 mg total) by mouth daily.   blood glucose meter kit and supplies Dispense based on patient and insurance preference. Use up to four times daily as directed. (FOR ICD-10 E10.9, E11.9).   calcium carbonate (OS-CAL) 1250 (500 Ca) MG chewable tablet Chew 1 tablet by mouth daily.   Cholecalciferol  (VITAMIN D3) 1000 units CAPS Take 1 capsule by mouth daily.   metFORMIN (GLUCOPHAGE) 1000 MG tablet Take 1 tablet (1,000 mg total) by mouth 2 (two) times daily with a meal.   Facility-Administered Encounter Medications as of 08/18/2021  Medication   0.9 %  sodium chloride infusion    Past Surgical History:  Procedure Laterality Date   ABDOMINAL HYSTERECTOMY     CATARACT EXTRACTION  03/2021   KNEE ARTHROSCOPY Right 07/03/2016   Procedure: RIGHT KNEE ARTHROSCOPY;  Surgeon: Carole Civil, MD;  Location: [facility];  Service: Orthopedics;  Laterality: Right;    Family History  Problem Relation Age of Onset   Hypertension Mother    Diabetes Mother    Heart disease Mother    Kidney disease Mother    Diabetes Father    Hypertension Sister    Diabetes Brother    Hypertension Brother    Hypertension Sister    Diabetes Brother    Hypertension Brother    Diabetes Brother    Hypertension Brother    Diabetes Brother    Kidney disease Brother    Colon cancer Neg Hx       Controlled substance contract: n/a     Review of Systems  Constitutional:  Negative for diaphoresis.  Eyes:  Negative for pain.  Respiratory:  Negative for shortness of breath.   Cardiovascular:  Negative for chest pain, palpitations and leg swelling.  Gastrointestinal:  Negative for abdominal pain.  Endocrine: Negative for polydipsia.  Skin:  Negative for rash.  Neurological:  Negative for dizziness, weakness and headaches.  Hematological:  Does not bruise/bleed easily.  All other systems reviewed and are negative.     Objective:   Physical Exam Vitals and nursing note reviewed.  Constitutional:      General: She is not in acute distress.    Appearance: Normal appearance. She is well-developed.  HENT:     Head: Normocephalic.     Right Ear: Tympanic membrane normal.     Left Ear: Tympanic membrane normal.     Nose: Nose normal.     Mouth/Throat:     Mouth: Mucous membranes are moist.  Eyes:      Pupils: Pupils are equal, round, and reactive to light.  Neck:     Vascular: No carotid bruit or JVD.  Cardiovascular:     Rate and Rhythm: Normal rate and regular rhythm.     Heart sounds: Normal heart sounds.  Pulmonary:     Effort: Pulmonary effort is normal. No respiratory distress.     Breath sounds: Normal breath sounds. No wheezing or rales.  Chest:     Chest wall: No tenderness.  Abdominal:     General: Bowel sounds are normal. There is no distension or abdominal bruit.     Palpations: Abdomen is soft. There is no hepatomegaly, splenomegaly, mass or pulsatile mass.     Tenderness: There is no abdominal tenderness.  Musculoskeletal:        General: Normal range of motion.     Cervical back: Normal range of motion and neck supple.  Lymphadenopathy:     Cervical: No cervical adenopathy.  Skin:    General: Skin is warm and dry.  Neurological:     Mental Status: She is alert and oriented to person, place, and time.     Deep Tendon Reflexes: Reflexes are normal and symmetric.  Psychiatric:        Behavior: Behavior normal.        Thought Content: Thought content normal.        Judgment: Judgment normal.    BP 106/73    Pulse 91    Temp (!) 96.3 F (35.7 C) (Temporal)    Resp 20    Ht 5' 8"  (1.727 m)    Wt 192 lb (87.1 kg)    BMI 29.19 kg/m        Assessment & Plan:  MADYN IVINS comes in today with chief complaint of No chief complaint on file.   Diagnosis and orders addressed:  1. Hyperlipidemia with target LDL less than 100 Low fat diet - Lipid panel - atorvastatin (LIPITOR) 40 MG tablet; Take 1 tablet (40 mg total) by mouth daily.  Dispense: 90 tablet; Refill: 1  2. Type 2 diabetes mellitus without complication, without long-term current use of insulin (HCC) Continue to watch carbs in diet - CBC with Differential/Platelet - CMP14+EGFR - Bayer DCA Hb A1c Waived - Bayer DCA Hb A1c Waived - Microalbumin / creatinine urine ratio - metFORMIN (GLUCOPHAGE) 1000  MG tablet; Take 1 tablet (1,000 mg total) by mouth 2 (two) times daily with a meal.  Dispense: 180 tablet; Refill: 3  4. Vitamin D deficiency Daily vitamin d supplement  5. BMI 28.0-28.9,adult Discussed diet and exercise for person with BMI >25 Will recheck weight in 3-6 months    Labs pending  Health Maintenance reviewed Diet and exercise encouraged  Follow up plan: 6 months   Mary-Margaret Hassell Done, FNP

## 2021-08-19 LAB — MICROALBUMIN / CREATININE URINE RATIO
Creatinine, Urine: 185.8 mg/dL
Microalb/Creat Ratio: 10 mg/g creat (ref 0–29)
Microalbumin, Urine: 18.2 ug/mL

## 2021-09-16 DIAGNOSIS — Z961 Presence of intraocular lens: Secondary | ICD-10-CM | POA: Diagnosis not present

## 2022-01-23 DIAGNOSIS — H43821 Vitreomacular adhesion, right eye: Secondary | ICD-10-CM | POA: Diagnosis not present

## 2022-01-23 DIAGNOSIS — H43811 Vitreous degeneration, right eye: Secondary | ICD-10-CM | POA: Diagnosis not present

## 2022-01-23 DIAGNOSIS — H31092 Other chorioretinal scars, left eye: Secondary | ICD-10-CM | POA: Diagnosis not present

## 2022-01-23 DIAGNOSIS — H35342 Macular cyst, hole, or pseudohole, left eye: Secondary | ICD-10-CM | POA: Diagnosis not present

## 2022-02-16 ENCOUNTER — Ambulatory Visit (INDEPENDENT_AMBULATORY_CARE_PROVIDER_SITE_OTHER): Payer: Medicare Other

## 2022-02-16 ENCOUNTER — Encounter: Payer: Self-pay | Admitting: Nurse Practitioner

## 2022-02-16 ENCOUNTER — Ambulatory Visit (INDEPENDENT_AMBULATORY_CARE_PROVIDER_SITE_OTHER): Payer: Medicare Other | Admitting: Nurse Practitioner

## 2022-02-16 VITALS — BP 118/82 | HR 86 | Temp 97.3°F | Resp 20 | Ht 68.0 in | Wt 201.0 lb

## 2022-02-16 DIAGNOSIS — Z6828 Body mass index (BMI) 28.0-28.9, adult: Secondary | ICD-10-CM

## 2022-02-16 DIAGNOSIS — E785 Hyperlipidemia, unspecified: Secondary | ICD-10-CM

## 2022-02-16 DIAGNOSIS — Z23 Encounter for immunization: Secondary | ICD-10-CM

## 2022-02-16 DIAGNOSIS — Z13820 Encounter for screening for osteoporosis: Secondary | ICD-10-CM | POA: Diagnosis not present

## 2022-02-16 DIAGNOSIS — E559 Vitamin D deficiency, unspecified: Secondary | ICD-10-CM

## 2022-02-16 DIAGNOSIS — M85851 Other specified disorders of bone density and structure, right thigh: Secondary | ICD-10-CM | POA: Diagnosis not present

## 2022-02-16 DIAGNOSIS — E119 Type 2 diabetes mellitus without complications: Secondary | ICD-10-CM | POA: Diagnosis not present

## 2022-02-16 DIAGNOSIS — Z780 Asymptomatic menopausal state: Secondary | ICD-10-CM

## 2022-02-16 LAB — BAYER DCA HB A1C WAIVED: HB A1C (BAYER DCA - WAIVED): 5.9 % — ABNORMAL HIGH (ref 4.8–5.6)

## 2022-02-16 MED ORDER — ATORVASTATIN CALCIUM 40 MG PO TABS: 40.0000 mg | ORAL_TABLET | Freq: Every day | ORAL | 1 refills | Status: DC

## 2022-02-16 MED ORDER — METFORMIN HCL 1000 MG PO TABS: 1000.0000 mg | ORAL_TABLET | Freq: Two times a day (BID) | ORAL | 1 refills | Status: DC

## 2022-02-16 NOTE — Progress Notes (Signed)
Subjective:    Patient ID: Lindsey Phillips, female    DOB: 1950-09-07, 71 y.o.   MRN: [id number]   Chief Complaint: Medical Management of Chronic Issues    HPI:  Lindsey Phillips is a 71 y.o. who identifies as a female who was assigned female at birth.   Social history: Lives with: lives by herself and her family checks in her daily Work history: retired   Scientist, forensic in today for follow up of the following chronic medical issues:  1. Hyperlipidemia with target LDL less than 100 Does try to watch diet. Does no dedicated exercise. Lab Results  Component Value Date   CHOL 176 08/18/2021   HDL 33 (L) 08/18/2021   LDLCALC 121 (H) 08/18/2021   TRIG 123 08/18/2021   CHOLHDL 5.3 (H) 08/18/2021     2. Type 2 diabetes mellitus without complication, without long-term current use of insulin (HCC) Fasting blood sugars are running 120-14. No low blood sugars Lab Results  Component Value Date   HGBA1C 6.1 (H) 08/18/2021     3. Vitamin D deficiency Is on daily vitamin d supplement  4. BMI 28.0-28.9,adult Weight is up 9lbs. Wt Readings from Last 3 Encounters:  02/16/22 201 lb (91.2 kg)  08/18/21 192 lb (87.1 kg)  05/30/21 200 lb (90.7 kg)   BMI Readings from Last 3 Encounters:  02/16/22 30.56 kg/m  08/18/21 29.19 kg/m  05/30/21 30.41 kg/m     New complaints: None today  No Known Allergies Outpatient Encounter Medications as of 02/16/2022  Medication Sig   Accu-Chek Softclix Lancets lancets SMARTSIG:Topical 1-4 Times Daily   atorvastatin (LIPITOR) 40 MG tablet Take 1 tablet (40 mg total) by mouth daily.   blood glucose meter kit and supplies Dispense based on patient and insurance preference. Use up to four times daily as directed. (FOR ICD-10 E10.9, E11.9).   calcium carbonate (OS-CAL) 1250 (500 Ca) MG chewable tablet Chew 1 tablet by mouth daily.   Cholecalciferol (VITAMIN D3) 1000 units CAPS Take 1 capsule by mouth daily.   metFORMIN (GLUCOPHAGE) 1000 MG tablet Take 1 tablet  (1,000 mg total) by mouth 2 (two) times daily with a meal.   Facility-Administered Encounter Medications as of 02/16/2022  Medication   0.9 %  sodium chloride infusion    Past Surgical History:  Procedure Laterality Date   ABDOMINAL HYSTERECTOMY     CATARACT EXTRACTION  03/2021   KNEE ARTHROSCOPY Right 07/03/2016   Procedure: RIGHT KNEE ARTHROSCOPY;  Surgeon: Carole Civil, MD;  Location: [facility];  Service: Orthopedics;  Laterality: Right;    Family History  Problem Relation Age of Onset   Hypertension Mother    Diabetes Mother    Heart disease Mother    Kidney disease Mother    Diabetes Father    Hypertension Sister    Diabetes Brother    Hypertension Brother    Hypertension Sister    Diabetes Brother    Hypertension Brother    Diabetes Brother    Hypertension Brother    Diabetes Brother    Kidney disease Brother    Colon cancer Neg Hx       Controlled substance contract: n/a     Review of Systems  Constitutional:  Negative for diaphoresis.  Eyes:  Negative for pain.  Respiratory:  Negative for shortness of breath.   Cardiovascular:  Negative for chest pain, palpitations and leg swelling.  Gastrointestinal:  Negative for abdominal pain.  Endocrine: Negative for polydipsia.  Skin:  Negative for rash.  Neurological:  Negative for dizziness, weakness and headaches.  Hematological:  Does not bruise/bleed easily.  All other systems reviewed and are negative.      Objective:   Physical Exam Vitals and nursing note reviewed.  Constitutional:      General: She is not in acute distress.    Appearance: Normal appearance. She is well-developed.  HENT:     Head: Normocephalic.     Right Ear: Tympanic membrane normal.     Left Ear: Tympanic membrane normal.     Nose: Nose normal.     Mouth/Throat:     Mouth: Mucous membranes are moist.  Eyes:     Pupils: Pupils are equal, round, and reactive to light.  Neck:     Vascular: No carotid bruit or JVD.   Cardiovascular:     Rate and Rhythm: Normal rate and regular rhythm.     Heart sounds: Normal heart sounds.  Pulmonary:     Effort: Pulmonary effort is normal. No respiratory distress.     Breath sounds: Normal breath sounds. No wheezing or rales.  Chest:     Chest wall: No tenderness.  Abdominal:     General: Bowel sounds are normal. There is no distension or abdominal bruit.     Palpations: Abdomen is soft. There is no hepatomegaly, splenomegaly, mass or pulsatile mass.     Tenderness: There is no abdominal tenderness.  Musculoskeletal:        General: Normal range of motion.     Cervical back: Normal range of motion and neck supple.  Lymphadenopathy:     Cervical: No cervical adenopathy.  Skin:    General: Skin is warm and dry.  Neurological:     Mental Status: She is alert and oriented to person, place, and time.     Deep Tendon Reflexes: Reflexes are normal and symmetric.  Psychiatric:        Behavior: Behavior normal.        Thought Content: Thought content normal.        Judgment: Judgment normal.     BP 118/82   Pulse 86   Temp (!) 97.3 F (36.3 C) (Temporal)   Resp 20   Ht 5' 8"  (1.727 m)   Wt 201 lb (91.2 kg)   SpO2 99%   BMI 30.56 kg/m   HGBA1c 5.9%      Assessment & Plan:   Lindsey Phillips comes in today with chief complaint of Medical Management of Chronic Issues   Diagnosis and orders addressed:  1. Hyperlipidemia with target LDL less than 100 Low fat diet - CMP14+EGFR - Lipid panel - atorvastatin (LIPITOR) 40 MG tablet; Take 1 tablet (40 mg total) by mouth daily.  Dispense: 90 tablet; Refill: 1  2. Type 2 diabetes mellitus without complication, without long-term current use of insulin (HCC) Continue to watch carbs in diet - Bayer DCA Hb A1c Waived - CBC with Differential/Platelet - metFORMIN (GLUCOPHAGE) 1000 MG tablet; Take 1 tablet (1,000 mg total) by mouth 2 (two) times daily with a meal.  Dispense: 180 tablet; Refill: 1  3. Vitamin D  deficiency Continue daily vitamin d supplement - VITAMIN D 25 Hydroxy (Vit-D Deficiency, Fractures)  4. BMI 28.0-28.9,adult Discussed diet and exercise for person with BMI >25 Will recheck weight in 3-6 months     Labs pending Health Maintenance reviewed Diet and exercise encouraged  Follow up plan: 6 months   Mary-Margaret Hassell Done, FNP

## 2022-02-16 NOTE — Patient Instructions (Signed)
Diabetes Mellitus and Foot Care Foot care is an important part of your health, especially when you have diabetes. Diabetes may cause you to have problems because of poor blood flow (circulation) to your feet and legs, which can cause your skin to: Become thinner and drier. Break more easily. Heal more slowly. Peel and crack. You may also have nerve damage (neuropathy) in your legs and feet, causing decreased feeling in them. This means that you may not notice minor injuries to your feet that could lead to more serious problems. Noticing and addressing any potential problems early is the best way to prevent future foot problems. How to care for your feet Foot hygiene  Wash your feet daily with warm water and mild soap. Do not use hot water. Then, pat your feet and the areas between your toes until they are completely dry. Do not soak your feet as this can dry your skin. Trim your toenails straight across. Do not dig under them or around the cuticle. File the edges of your nails with an emery board or nail file. Apply a moisturizing lotion or petroleum jelly to the skin on your feet and to dry, brittle toenails. Use lotion that does not contain alcohol and is unscented. Do not apply lotion between your toes. Shoes and socks Wear clean socks or stockings every day. Make sure they are not too tight. Do not wear knee-high stockings since they may decrease blood flow to your legs. Wear shoes that fit properly and have enough cushioning. Always look in your shoes before you put them on to be sure there are no objects inside. To break in new shoes, wear them for just a few hours a day. This prevents injuries on your feet. Wounds, scrapes, corns, and calluses  Check your feet daily for blisters, cuts, bruises, sores, and redness. If you cannot see the bottom of your feet, use a mirror or ask someone for help. Do not cut corns or calluses or try to remove them with medicine. If you find a minor scrape,  cut, or break in the skin on your feet, keep it and the skin around it clean and dry. You may clean these areas with mild soap and water. Do not clean the area with peroxide, alcohol, or iodine. If you have a wound, scrape, corn, or callus on your foot, look at it several times a day to make sure it is healing and not infected. Check for: Redness, swelling, or pain. Fluid or blood. Warmth. Pus or a bad smell. General tips Do not cross your legs. This may decrease blood flow to your feet. Do not use heating pads or hot water bottles on your feet. They may burn your skin. If you have lost feeling in your feet or legs, you may not know this is happening until it is too late. Protect your feet from hot and cold by wearing shoes, such as at the beach or on hot pavement. Schedule a complete foot exam at least once a year (annually) or more often if you have foot problems. Report any cuts, sores, or bruises to your health care provider immediately. Where to find more information American Diabetes Association: www.diabetes.org Association of Diabetes [employer] & Education Specialists: www.diabeteseducator.org Contact a health care provider if: You have a medical condition that increases your risk of infection and you have any cuts, sores, or bruises on your feet. You have an injury that is not healing. You have redness on your legs or feet. You   feel burning or tingling in your legs or feet. You have pain or cramps in your legs and feet. Your legs or feet are numb. Your feet always feel cold. You have pain around any toenails. Get help right away if: You have a wound, scrape, corn, or callus on your foot and: You have pain, swelling, or redness that gets worse. You have fluid or blood coming from the wound, scrape, corn, or callus. Your wound, scrape, corn, or callus feels warm to the touch. You have pus or a bad smell coming from the wound, scrape, corn, or callus. You have a fever. You have a red  line going up your leg. Summary Check your feet every day for blisters, cuts, bruises, sores, and redness. Apply a moisturizing lotion or petroleum jelly to the skin on your feet and to dry, brittle toenails. Wear shoes that fit properly and have enough cushioning. If you have foot problems, report any cuts, sores, or bruises to your health care provider immediately. Schedule a complete foot exam at least once a year (annually) or more often if you have foot problems. This information is not intended to replace advice given to you by your health care provider. Make sure you discuss any questions you have with your health care provider. Document Revised: 01/22/2020 Document Reviewed: 01/22/2020 Elsevier Patient Education  2023 [employer]  

## 2022-02-17 LAB — CBC WITH DIFFERENTIAL/PLATELET
Basophils Absolute: 0 10*3/uL (ref 0.0–0.2)
Basos: 0 %
EOS (ABSOLUTE): 0.1 10*3/uL (ref 0.0–0.4)
Eos: 2 %
Hematocrit: 36.1 % (ref 34.0–46.6)
Hemoglobin: 11.7 g/dL (ref 11.1–15.9)
Immature Grans (Abs): 0 10*3/uL (ref 0.0–0.1)
Immature Granulocytes: 0 %
Lymphocytes Absolute: 2.5 10*3/uL (ref 0.7–3.1)
Lymphs: 45 %
MCH: 26.7 pg (ref 26.6–33.0)
MCHC: 32.4 g/dL (ref 31.5–35.7)
MCV: 82 fL (ref 79–97)
Monocytes Absolute: 0.4 10*3/uL (ref 0.1–0.9)
Monocytes: 7 %
Neutrophils Absolute: 2.6 10*3/uL (ref 1.4–7.0)
Neutrophils: 46 %
Platelets: 283 10*3/uL (ref 150–450)
RBC: 4.38 x10E6/uL (ref 3.77–5.28)
RDW: 14 % (ref 11.7–15.4)
WBC: 5.6 10*3/uL (ref 3.4–10.8)

## 2022-02-17 LAB — CMP14+EGFR
ALT: 10 IU/L (ref 0–32)
AST: 11 IU/L (ref 0–40)
Albumin/Globulin Ratio: 1.4 (ref 1.2–2.2)
Albumin: 4.2 g/dL (ref 3.8–4.8)
Alkaline Phosphatase: 110 IU/L (ref 44–121)
BUN/Creatinine Ratio: 20 (ref 12–28)
BUN: 20 mg/dL (ref 8–27)
Bilirubin Total: 0.3 mg/dL (ref 0.0–1.2)
CO2: 23 mmol/L (ref 20–29)
Calcium: 10.1 mg/dL (ref 8.7–10.3)
Chloride: 101 mmol/L (ref 96–106)
Creatinine, Ser: 1.01 mg/dL — ABNORMAL HIGH (ref 0.57–1.00)
Globulin, Total: 2.9 g/dL (ref 1.5–4.5)
Glucose: 101 mg/dL — ABNORMAL HIGH (ref 70–99)
Potassium: 5 mmol/L (ref 3.5–5.2)
Sodium: 137 mmol/L (ref 134–144)
Total Protein: 7.1 g/dL (ref 6.0–8.5)
eGFR: 60 mL/min/{1.73_m2} (ref >59)

## 2022-02-17 LAB — LIPID PANEL
Chol/HDL Ratio: 5.5 ratio — ABNORMAL HIGH (ref 0.0–4.4)
Cholesterol, Total: 180 mg/dL (ref 100–199)
HDL: 33 mg/dL — ABNORMAL LOW (ref >39)
LDL Chol Calc (NIH): 121 mg/dL — ABNORMAL HIGH (ref 0–99)
Triglycerides: 142 mg/dL (ref 0–149)
VLDL Cholesterol Cal: 26 mg/dL (ref 5–40)

## 2022-02-17 LAB — VITAMIN D 25 HYDROXY (VIT D DEFICIENCY, FRACTURES): Vit D, 25-Hydroxy: 36.2 ng/mL (ref 30.0–100.0)

## 2022-02-17 NOTE — Addendum Note (Signed)
Addended by: Cleda Daub on: 02/17/2022 07:47 AM   Modules accepted: Orders

## 2022-02-22 IMAGING — MG MM DIGITAL SCREENING BILAT W/ TOMO AND CAD
8 series · 8 of 24 positions shown · non-contrast
Comparison: Previous exam(s).

ACR Breast Density Category a: The breast tissue is almost entirely
fatty.

CLINICAL DATA: Screening.

EXAM:
DIGITAL SCREENING BILATERAL MAMMOGRAM WITH TOMOSYNTHESIS AND CAD
TECHNIQUE: Bilateral screening digital craniocaudal and mediolateral oblique
mammograms were obtained. Bilateral screening digital breast
tomosynthesis was performed. The images were evaluated with
computer-aided detection.

[L CC synth-2D]
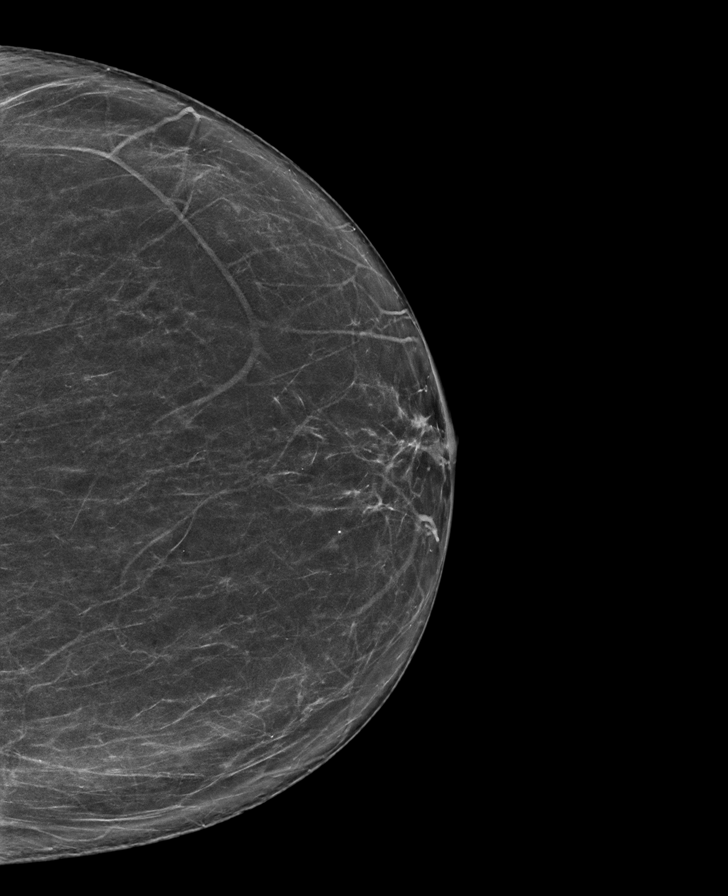

[R MLO synth-2D]
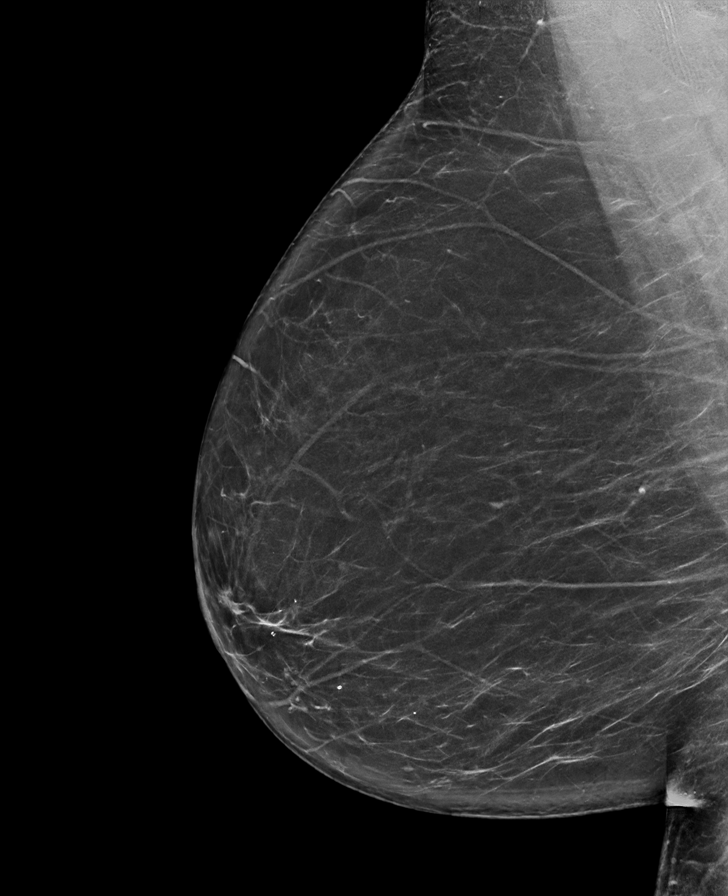

[R CC synth-2D]
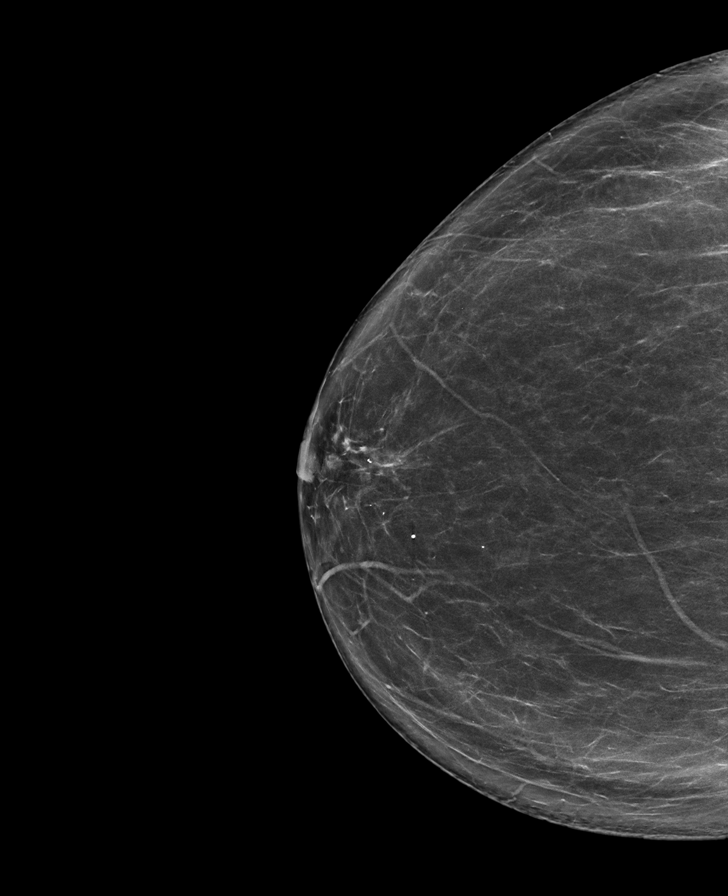

[L MLO synth-2D]
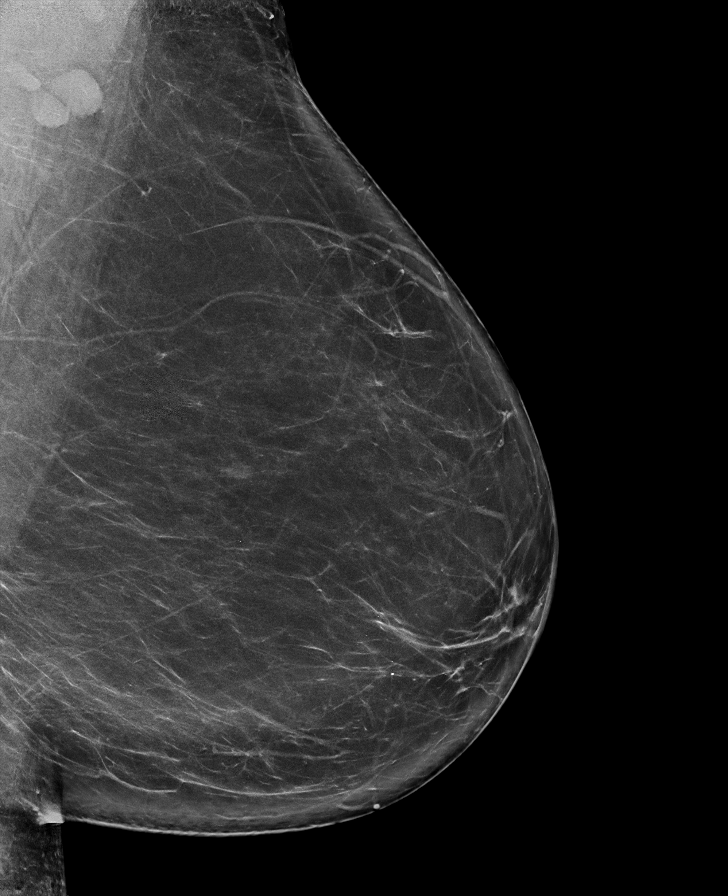

[L MLO tomo · tomo slice 45/89.0]
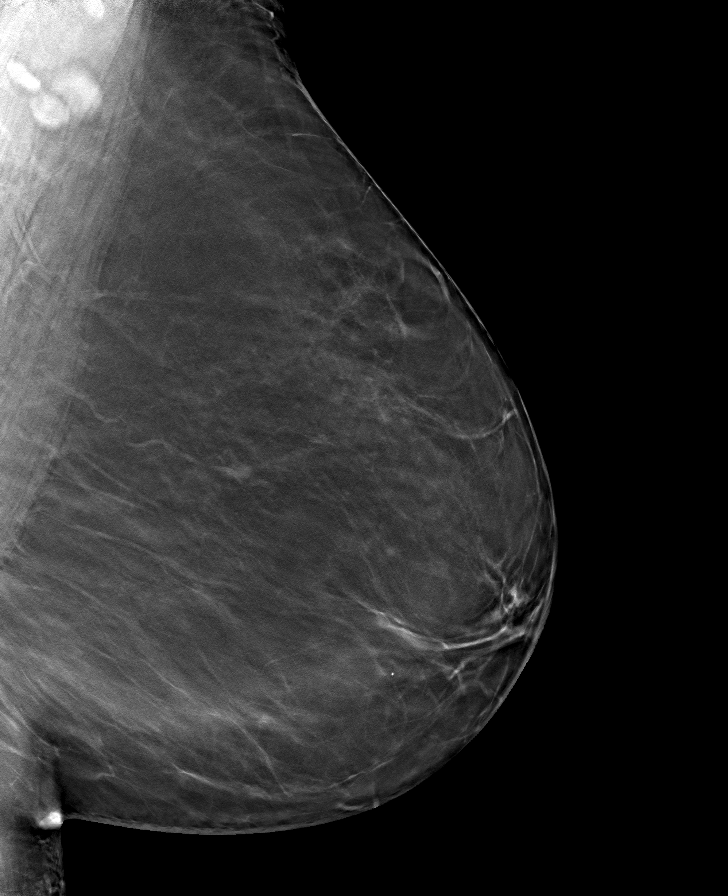

[R MLO tomo · tomo slice 44/87.0]
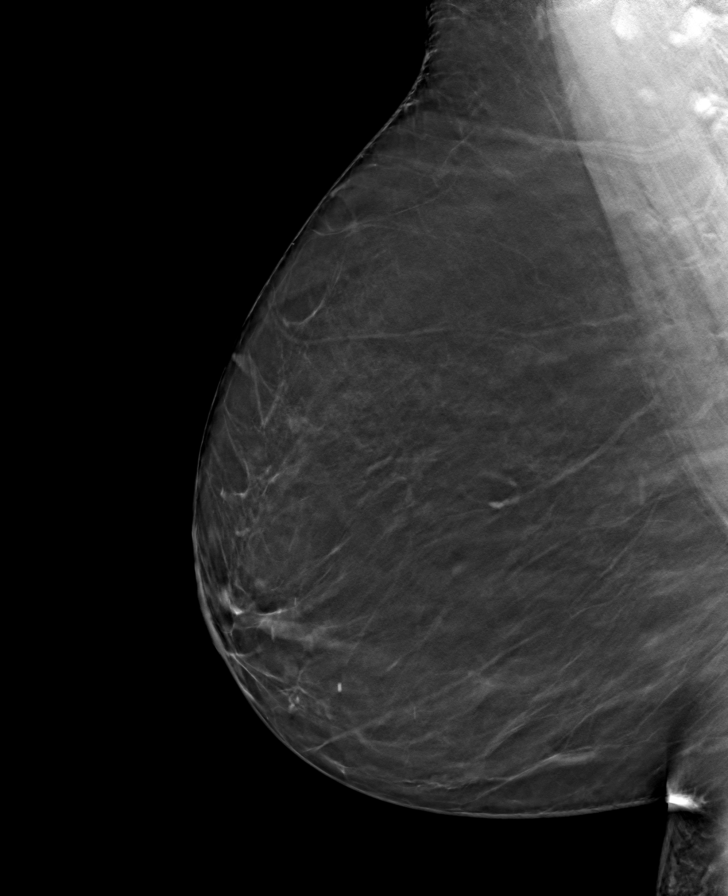

[L CC tomo · tomo slice 39/77.0]
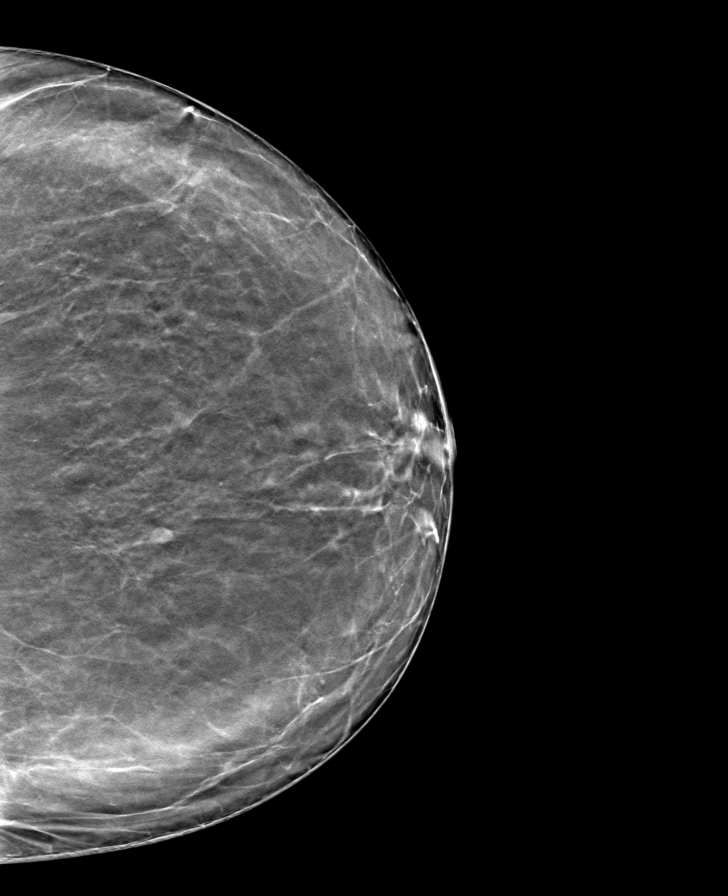

[R CC tomo · tomo slice 41/82.0]
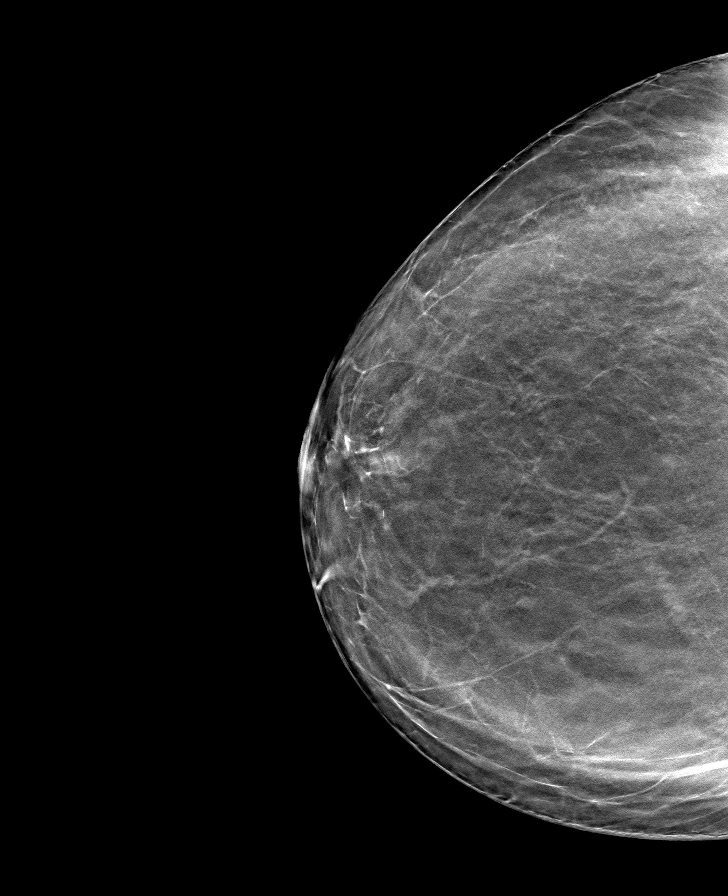

[8 of 24 positions shown; findings below may reference images not displayed]

FINDINGS: There are no findings suspicious for malignancy.
IMPRESSION: No mammographic evidence of malignancy. A result letter of this
screening mammogram will be mailed directly to the patient.

RECOMMENDATION:
Screening mammogram in one year. (Code:0E-3-N98)

BI-RADS CATEGORY  1: Negative.

## 2022-05-16 ENCOUNTER — Other Ambulatory Visit: Payer: Self-pay | Admitting: Nurse Practitioner

## 2022-05-31 ENCOUNTER — Ambulatory Visit (INDEPENDENT_AMBULATORY_CARE_PROVIDER_SITE_OTHER): Payer: Medicare Other

## 2022-05-31 VITALS — Ht 69.0 in | Wt 200.0 lb

## 2022-05-31 DIAGNOSIS — Z0000 Encounter for general adult medical examination without abnormal findings: Secondary | ICD-10-CM

## 2022-05-31 DIAGNOSIS — Z1231 Encounter for screening mammogram for malignant neoplasm of breast: Secondary | ICD-10-CM

## 2022-05-31 NOTE — Patient Instructions (Signed)
[name] , Thank you for taking time to come for your Medicare Wellness Visit. I appreciate your ongoing commitment to your health goals. Please review the following plan we discussed and let me know if I can assist you in the future.   These are the goals we discussed:  Goals      DIET - INCREASE WATER INTAKE     Try to drink 6-8 glasses of water daily     Exercise 3x per week (30 min per time)     [employer] and include participating in [employer] group or stationary bike/recumbent stepper in current exercise regimen.    05/27/2020 AWV Goal: Exercise for General Health  Patient will verbalize understanding of the benefits of increased physical activity: Exercising regularly is important. It will improve your overall fitness, flexibility, and endurance. Regular exercise also will improve your overall health. It can help you control your weight, reduce stress, and improve your bone density. Over the next year, patient will increase physical activity as tolerated with a goal of at least 150 minutes of moderate physical activity per week.  You can tell that you are exercising at a moderate intensity if your heart starts beating faster and you start breathing faster but can still hold a conversation. Moderate-intensity exercise ideas include: Walking 1 mile (1.6 km) in about 15 minutes Biking Hiking Golfing Dancing Water aerobics Patient will verbalize understanding of everyday activities that increase physical activity by providing examples like the following: Yard work, such as: Insurance underwriter Gardening Washing windows or floors Patient will be able to explain general safety guidelines for exercising:  Before you start a new exercise program, talk with your health care provider. Do not exercise so much that you hurt yourself, feel dizzy, or get very short of breath. Wear comfortable clothes  and wear shoes with good support. Drink plenty of water while you exercise to prevent dehydration or heat stroke. Work out until your breathing and your heartbeat get faster.      Exercise 3x per week (30 min per time)     Walk 3 times per week for 30 minutes each session.     Increase lean proteins     Decrease intake of processed meat like bacon and sausage - increase intake of lean protein such as [place], chicken, fish- broiled, grilled, baked.  Avoid frying foods.        This is a list of the screening recommended for you and due dates:  Health Maintenance  Topic Date Due   COVID-19 Vaccine (5 - Moderna series) 02/10/2021   Flu Shot  02/14/2022   Eye exam for diabetics  02/14/2022   Zoster (Shingles) Vaccine (2 of 2) 04/13/2022   Mammogram  05/23/2022   Tetanus Vaccine  08/18/2022*   Yearly kidney health urinalysis for diabetes  08/18/2022   Complete foot exam   08/18/2022   Hemoglobin A1C  08/19/2022   Yearly kidney function blood test for diabetes  02/17/2023   Medicare Annual Wellness Visit  06/01/2023   DEXA scan (bone density measurement)  02/17/2024   Colon Cancer Screening  03/15/2027   Pneumonia Vaccine  Completed   Hepatitis C Screening: USPSTF Recommendation to screen - Ages 46-79 yo.  Completed   HPV Vaccine  Aged Out  *Topic was postponed. The date shown is not the original due date.    Advanced directives: Advance directive discussed with you  today. I have provided a copy for you to complete at home and have notarized. Once this is complete please bring a copy in to our office so we can scan it into your chart.   Conditions/risks identified: Aim for 30 minutes of exercise or brisk walking, 6-8 glasses of water, and 5 servings of fruits and vegetables each day.   Next appointment: Follow up in one year for your annual wellness visit    Preventive Care 65 Years and Older, Female Preventive care refers to lifestyle choices and visits with your health care  provider that can promote health and wellness. What does preventive care include? A yearly physical exam. This is also called an annual well check. Dental exams once or twice a year. Routine eye exams. Ask your health care provider how often you should have your eyes checked. Personal lifestyle choices, including: Daily care of your teeth and gums. Regular physical activity. Eating a healthy diet. Avoiding tobacco and drug use. Limiting alcohol use. Practicing safe sex. Taking low-dose aspirin every day. Taking vitamin and mineral supplements as recommended by your health care provider. What happens during an annual well check? The services and screenings done by your health care provider during your annual well check will depend on your age, overall health, lifestyle risk factors, and family history of disease. Counseling  Your health care provider may ask you questions about your: Alcohol use. Tobacco use. Drug use. Emotional well-being. Home and relationship well-being. Sexual activity. Eating habits. History of falls. Memory and ability to understand (cognition). Work and work Astronomer. Reproductive health. Screening  You may have the following tests or measurements: Height, weight, and BMI. Blood pressure. Lipid and cholesterol levels. These may be checked every 5 years, or more frequently if you are over 51 years old. Skin check. Lung cancer screening. You may have this screening every year starting at age 67 if you have a 30-pack-year history of smoking and currently smoke or have quit within the past 15 years. Fecal occult blood test (FOBT) of the stool. You may have this test every year starting at age 67. Flexible sigmoidoscopy or colonoscopy. You may have a sigmoidoscopy every 5 years or a colonoscopy every 10 years starting at age 71. Hepatitis C blood test. Hepatitis B blood test. Sexually transmitted disease (STD) testing. Diabetes screening. This is done by  checking your blood sugar (glucose) after you have not eaten for a while (fasting). You may have this done every 1-3 years. Bone density scan. This is done to screen for osteoporosis. You may have this done starting at age 76. Mammogram. This may be done every 1-2 years. Talk to your health care provider about how often you should have regular mammograms. Talk with your health care provider about your test results, treatment options, and if necessary, the need for more tests. Vaccines  Your health care provider may recommend certain vaccines, such as: Influenza vaccine. This is recommended every year. Tetanus, diphtheria, and acellular pertussis (Tdap, Td) vaccine. You may need a Td booster every 10 years. Zoster vaccine. You may need this after age 27. Pneumococcal 13-valent conjugate (PCV13) vaccine. One dose is recommended after age 51. Pneumococcal polysaccharide (PPSV23) vaccine. One dose is recommended after age 47. Talk to your health care provider about which screenings and vaccines you need and how often you need them. This information is not intended to replace advice given to you by your health care provider. Make sure you discuss any questions you have with your  health care provider. Document Released: 07/30/2015 Document Revised: 03/22/2016 Document Reviewed: 05/04/2015 Elsevier Interactive Patient Education  2017 [employer].  Fall Prevention in the Home Falls can cause injuries. They can happen to people of all ages. There are many things you can do to make your home safe and to help prevent falls. What can I do on the outside of my home? Regularly fix the edges of walkways and driveways and fix any cracks. Remove anything that might make you trip as you walk through a door, such as a raised step or threshold. Trim any bushes or trees on the path to your home. Use bright outdoor lighting. Clear any walking paths of anything that might make someone trip, such as rocks or  tools. Regularly check to see if handrails are loose or broken. Make sure that both sides of any steps have handrails. Any raised decks and porches should have guardrails on the edges. Have any leaves, snow, or ice cleared regularly. Use sand or salt on walking paths during winter. Clean up any spills in your garage right away. This includes oil or grease spills. What can I do in the bathroom? Use night lights. Install grab bars by the toilet and in the tub and shower. Do not use towel bars as grab bars. Use non-skid mats or decals in the tub or shower. If you need to sit down in the shower, use a plastic, non-slip stool. Keep the floor dry. Clean up any water that spills on the floor as soon as it happens. Remove soap buildup in the tub or shower regularly. Attach bath mats securely with double-sided non-slip rug tape. Do not have throw rugs and other things on the floor that can make you trip. What can I do in the bedroom? Use night lights. Make sure that you have a light by your bed that is easy to reach. Do not use any sheets or blankets that are too big for your bed. They should not hang down onto the floor. Have a firm chair that has side arms. You can use this for support while you get dressed. Do not have throw rugs and other things on the floor that can make you trip. What can I do in the kitchen? Clean up any spills right away. Avoid walking on wet floors. Keep items that you use a lot in easy-to-reach places. If you need to reach something above you, use a strong step stool that has a grab bar. Keep electrical cords out of the way. Do not use floor polish or wax that makes floors slippery. If you must use wax, use non-skid floor wax. Do not have throw rugs and other things on the floor that can make you trip. What can I do with my stairs? Do not leave any items on the stairs. Make sure that there are handrails on both sides of the stairs and use them. Fix handrails that are  broken or loose. Make sure that handrails are as long as the stairways. Check any carpeting to make sure that it is firmly attached to the stairs. Fix any carpet that is loose or worn. Avoid having throw rugs at the top or bottom of the stairs. If you do have throw rugs, attach them to the floor with carpet tape. Make sure that you have a light switch at the top of the stairs and the bottom of the stairs. If you do not have them, ask someone to add them for you. What else can  I do to help prevent falls? Wear shoes that: Do not have high heels. Have rubber bottoms. Are comfortable and fit you well. Are closed at the toe. Do not wear sandals. If you use a stepladder: Make sure that it is fully opened. Do not climb a closed stepladder. Make sure that both sides of the stepladder are locked into place. Ask someone to hold it for you, if possible. Clearly mark and make sure that you can see: Any grab bars or handrails. First and last steps. Where the edge of each step is. Use tools that help you move around (mobility aids) if they are needed. These include: Canes. Walkers. Scooters. Crutches. Turn on the lights when you go into a dark area. Replace any light bulbs as soon as they burn out. Set up your furniture so you have a clear path. Avoid moving your furniture around. If any of your floors are uneven, fix them. If there are any pets around you, be aware of where they are. Review your medicines with your doctor. Some medicines can make you feel dizzy. This can increase your chance of falling. Ask your doctor what other things that you can do to help prevent falls. This information is not intended to replace advice given to you by your health care provider. Make sure you discuss any questions you have with your health care provider. Document Released: 04/29/2009 Document Revised: 12/09/2015 Document Reviewed: 08/07/2014 Elsevier Interactive Patient Education  2017 [employer].

## 2022-05-31 NOTE — Progress Notes (Signed)
Subjective:   Lindsey Phillips is a 71 y.o. female who presents for Medicare Annual (Subsequent) preventive examination. I connected with  Lindsey Phillips on 05/31/22 by a audio enabled telemedicine application and verified that I am speaking with the correct person using two identifiers.  Patient Location: Home  Provider Location: Home Office  I discussed the limitations of evaluation and management by telemedicine. The patient expressed understanding and agreed to proceed.  Review of Systems     Cardiac Risk Factors include: advanced age (>52mn, >>92women);diabetes mellitus     Objective:    Today's Vitals   05/31/22 1045  Weight: 200 lb (90.7 kg)  Height: _0  (1.753 m)   Body mass index is 29.53 kg/m.     05/31/2022   10:49 AM 05/30/2021   10:45 AM 05/27/2020   11:02 AM 05/27/2019   10:41 AM 05/14/2018    2:26 PM 02/28/2017    9:58 AM 12/27/2016   10:25 AM  Advanced Directives  Does Patient Have a Medical Advance Directive? _1  No No  Would patient like information on creating a medical advance directive? No - Patient declined No - Patient declined No - Patient declined No - Patient declined No - Patient declined  Yes (ED - Information included in AVS)    Current Medications (verified) Outpatient Encounter Medications as of 05/31/2022  Medication Sig   Accu-Chek Softclix Lancets lancets SMARTSIG:Topical 1-4 Times Daily   atorvastatin (LIPITOR) 40 MG tablet Take 1 tablet (40 mg total) by mouth daily.   blood glucose meter kit and supplies Dispense based on patient and insurance preference. Use up to four times daily as directed. (FOR ICD-10 E10.9, E11.9).   calcium carbonate (OS-CAL) 1250 (500 Ca) MG chewable tablet Chew 1 tablet by mouth daily.   Cholecalciferol (VITAMIN D3) 1000 units CAPS Take 1 capsule by mouth daily.   glucose blood (ACCU-CHEK GUIDE) test strip TEST BLOOD SUGAR 4 TIMES DAILY DX E11.65   metFORMIN (GLUCOPHAGE) 1000 MG tablet Take 1 tablet  (1,000 mg total) by mouth 2 (two) times daily with a meal.   Facility-Administered Encounter Medications as of 05/31/2022  Medication   0.9 %  sodium chloride infusion    Allergies (verified) Patient has no known allergies.   History: Past Medical History:  Diagnosis Date   Arthritis    Hyperlipidemia    Vitamin D deficiency    Past Surgical History:  Procedure Laterality Date   ABDOMINAL HYSTERECTOMY     CATARACT EXTRACTION  03/2021   KNEE ARTHROSCOPY Right 07/03/2016   Procedure: RIGHT KNEE ARTHROSCOPY;  Surgeon: SCarole Civil MD;  Location: [facility];  Service: Orthopedics;  Laterality: Right;   Family History  Problem Relation Age of Onset   Hypertension Mother    Diabetes Mother    Heart disease Mother    Kidney disease Mother    Diabetes Father    Hypertension Sister    Diabetes Brother    Hypertension Brother    Hypertension Sister    Diabetes Brother    Hypertension Brother    Diabetes Brother    Hypertension Brother    Diabetes Brother    Kidney disease Brother    Colon cancer Neg Hx    Social History   Socioeconomic History   Marital status: Divorced    Spouse name: Not on file   Number of children: 1   Years of education: 11   Highest education level: 11th grade  Occupational History   Occupation: retired  Tobacco Use   Smoking status: Former    Packs/day: 0.25    Years: 15.00    Total pack years: 3.75    Types: Cigarettes    Quit date: 07/17/2013    Years since quitting: 8.8   Smokeless tobacco: Never  Vaping Use   Vaping Use: Never used  Substance and Sexual Activity   Alcohol use: No   Drug use: No   Sexual activity: Not Currently    Birth control/protection: Surgical  Other Topics Concern   Not on file  Social History Narrative   Lives alone. Sister next door   Visits her aunt in nursing home twice per week   Daughter in [place] Strain: Low Risk  (05/31/2022)   Overall  Financial Resource Strain (CARDIA)    Difficulty of Paying Living Expenses: Not hard at all  Food Insecurity: No Food Insecurity (05/31/2022)   Hunger Vital Sign    Worried About Running Out of Food in the Last Year: Never true    Ran Out of Food in the Last Year: Never true  Transportation Needs: No Transportation Needs (05/31/2022)   PRAPARE - Hydrologist (Medical): No    Lack of Transportation (Non-Medical): No  Physical Activity: Insufficiently Active (05/31/2022)   Exercise Vital Sign    Days of Exercise per Week: 3 days    Minutes of Exercise per Session: 30 min  Stress: No Stress Concern Present (05/31/2022)   [place]    Feeling of Stress : Not at all  Social Connections: Moderately Isolated (05/31/2022)   Social Connection and Isolation Panel [NHANES]    Frequency of Communication with Friends and Family: More than three times a week    Frequency of Social Gatherings with Friends and Family: More than three times a week    Attends Religious Services: More than 4 times per year    Active Member of [employer] or Organizations: No    Attends Music therapist: Never    Marital Status: Divorced    Tobacco Counseling Counseling given: Not Answered   Clinical Intake:  Pre-visit preparation completed: Yes  Pain : No/denies pain     Nutritional Risks: None Diabetes: No  How often do you need to have someone help you when you read instructions, pamphlets, or other written materials from your doctor or pharmacy?: 1 - Never  Diabetic?yes Nutrition Risk Assessment:  Has the patient had any N/V/D within the last 2 months?  No  Does the patient have any non-healing wounds?  No  Has the patient had any unintentional weight loss or weight gain?  No   Diabetes:  Is the patient diabetic?  No  If diabetic, was a CBG obtained today?  No  Did the patient bring in their  glucometer from home?  No  How often do you monitor your CBG's? 2 x day .   Financial Strains and Diabetes Management:  Are you having any financial strains with the device, your supplies or your medication? No .  Does the patient want to be seen by Chronic Care Management for management of their diabetes?  No  Would the patient like to be referred to a Nutritionist or for Diabetic Management?  No   Diabetic Exams:  Diabetic Eye Exam: Overdue for diabetic eye exam. Pt has been advised about the importance  in completing this exam. Patient advised to call and schedule an eye exam. Diabetic Foot Exam: Overdue, Pt has been advised about the importance in completing this exam. Pt is scheduled for diabetic foot exam on next office visit .   Interpreter Needed?: No  Information entered by :: Jadene Pierini, LPN   Activities of Daily Living    05/31/2022   10:50 AM  In your present state of health, do you have any difficulty performing the following activities:  Hearing? 0  Vision? 0  Difficulty concentrating or making decisions? 0  Walking or climbing stairs? 0  Dressing or bathing? 0  Doing errands, shopping? 0  Preparing Food and eating ? N  Using the Toilet? N  In the past six months, have you accidently leaked urine? N  Do you have problems with loss of bowel control? N  Managing your Medications? N  Managing your Finances? N  Housekeeping or managing your Housekeeping? N    Patient Care Team: Chevis Pretty, FNP as PCP - General (Nurse Practitioner) Carole Civil, MD as Consulting Physician (Orthopedic Surgery)  Indicate any recent Medical Services you may have received from other than [facility] providers in the past year (date may be approximate).     Assessment:   This is a routine wellness examination for Francie.  Hearing/Vision screen Vision Screening - Comments:: Wears rx glasses - up to date with routine eye exams with    Dietary issues and exercise  activities discussed: Current Exercise Habits: Home exercise routine, Type of exercise: walking, Time (Minutes): 30, Frequency (Times/Week): 3, Weekly Exercise (Minutes/Week): 90, Intensity: Mild, Exercise limited by: None identified   Goals Addressed             This Visit's Progress    DIET - INCREASE WATER INTAKE   On track    Try to drink 6-8 glasses of water daily       Depression Screen    05/31/2022   10:48 AM 02/16/2022   10:01 AM 08/18/2021   10:28 AM 05/30/2021   10:38 AM 05/17/2021   10:00 AM 04/20/2021    9:22 AM 12/30/2020   10:49 AM  PHQ 2/9 Scores  PHQ - 2 Score 0 0 0 0 0 0 0  PHQ- 9 Score  0 0 0 1 0 1    Fall Risk    05/31/2022   10:46 AM 02/16/2022   10:00 AM 08/18/2021   10:28 AM 05/30/2021   10:45 AM 05/17/2021   10:00 AM  [place] in the past year? 0 0 0 0 0  Number falls in past yr: 0   0   Injury with Fall? 0   0   Risk for fall due to : No Fall Risks   Orthopedic patient   Follow up Falls prevention discussed   Falls prevention discussed     [place]:  Any stairs in or around the home? No  If so, are there any without handrails? No  Home free of loose throw rugs in walkways, pet beds, electrical cords, etc? Yes  Adequate lighting in your home to reduce risk of falls? Yes   ASSISTIVE DEVICES UTILIZED TO PREVENT FALLS:  Life alert? No  Use of a cane, walker or w/c? No  Grab bars in the bathroom? No  Shower chair or bench in shower? No  Elevated toilet seat or a handicapped toilet? No       05/14/2018  5:05 PM 12/27/2016   10:29 AM  MMSE - Mini Mental State Exam  Orientation to time 5 5  Orientation to Place 5 5  Registration 3 3  Attention/ Calculation 5 2  Recall 2 0  Language- name 2 objects 2 2  Language- repeat 1 1  Language- follow 3 step command 3 3  Language- read & follow direction 1 1  Write a sentence 1 1  Copy design 1 0  Total score 29 23        05/31/2022   10:50 AM  05/30/2021   10:40 AM 05/27/2020   11:06 AM 05/27/2019   10:44 AM  6CIT Screen  What Year? 0 points 0 points 0 points 0 points  What month? 0 points 0 points 0 points 0 points  What time? 0 points 0 points 0 points 0 points  Count back from 20 0 points 0 points 0 points 0 points  Months in reverse 0 points 0 points 0 points 0 points  Repeat phrase 0 points 2 points 0 points 4 points  Total Score 0 points 2 points 0 points 4 points    Immunizations Immunization History  Administered Date(s) Administered   Fluad Quad(high Dose 65+) 04/30/2021   Influenza, High Dose Seasonal PF 05/07/2017, 04/25/2020   Influenza, Quadrivalent, Recombinant, Inj, Pf 04/21/2019   Influenza,inj,Quad PF,6+ Mos 04/16/2018   Influenza-Unspecified 04/16/2016, 05/07/2017, 04/25/2020   Moderna SARS-COV2 Booster Vaccination 12/16/2020   Moderna Sars-Covid-2 Vaccination 08/26/2019, 09/23/2019, 12/17/2019, 05/11/2020   Pneumococcal Conjugate-13 11/18/2015   Pneumococcal Polysaccharide-23 12/27/2016   Zoster Recombinat (Shingrix) 02/16/2022    TDAP status: Due, Education has been provided regarding the importance of this vaccine. Advised may receive this vaccine at local pharmacy or Health Dept. Aware to provide a copy of the vaccination record if obtained from local pharmacy or Health Dept. Verbalized acceptance and understanding.  Flu Vaccine status: Up to date  Pneumococcal vaccine status: Up to date  Covid-19 vaccine status: Completed vaccines  Qualifies for Shingles Vaccine? Yes   Zostavax completed Yes   Shingrix Completed?: Yes  Screening Tests Health Maintenance  Topic Date Due   COVID-19 Vaccine (5 - Moderna series) 02/10/2021   INFLUENZA VACCINE  02/14/2022   OPHTHALMOLOGY EXAM  02/14/2022   Zoster Vaccines- Shingrix (2 of 2) 04/13/2022   MAMMOGRAM  05/23/2022   TETANUS/TDAP  08/18/2022 (Originally 08/23/1969)   Diabetic kidney evaluation - Urine ACR  08/18/2022   FOOT EXAM  08/18/2022    HEMOGLOBIN A1C  08/19/2022   Diabetic kidney evaluation - GFR measurement  02/17/2023   Medicare Annual Wellness (AWV)  06/01/2023   DEXA SCAN  02/17/2024   COLONOSCOPY (Pts 45-77yr Insurance coverage will need to be confirmed)  03/15/2027   Pneumonia Vaccine 71 Years old  Completed   Hepatitis C Screening  Completed   HPV VACCINES  Aged Out    Health Maintenance  Health Maintenance Due  Topic Date Due   COVID-19 Vaccine (5 - Moderna series) 02/10/2021   INFLUENZA VACCINE  02/14/2022   OPHTHALMOLOGY EXAM  02/14/2022   Zoster Vaccines- Shingrix (2 of 2) 04/13/2022   MAMMOGRAM  05/23/2022    Colorectal cancer screening: Type of screening: Colonoscopy. Completed 03/14/2017. Repeat every 10 years  Mammogram status: Ordered 05/31/2022. Pt provided with contact info and advised to call to schedule appt.   Bone Density status: Completed 02/16/2022. Results reflect: Bone density results: OSTEOPENIA. Repeat every 5 years.  Lung Cancer Screening: (Low Dose CT Chest recommended if Age  55-80 years, 30 pack-year currently smoking OR have quit w/in 15years.) does not qualify.   Lung Cancer Screening Referral: n/a  Additional Screening:  Hepatitis C Screening: does not qualify;11/28/2015  Vision Screening: Recommended annual ophthalmology exams for early detection of glaucoma and other disorders of the eye. Is the patient up to date with their annual eye exam?  Yes  Who is the provider or what is the name of the office in which the patient attends annual eye exams? [facility]  If pt is not established with a provider, would they like to be referred to a provider to establish care? No .   Dental Screening: Recommended annual dental exams for proper oral hygiene  Community Resource Referral / Chronic Care Management: CRR required this visit?  No   CCM required this visit?  No      Plan:     I have personally reviewed and noted the following in the patient's chart:   Medical  and social history Use of alcohol, tobacco or illicit drugs  Current medications and supplements including opioid prescriptions. Patient is not currently taking opioid prescriptions. Functional ability and status Nutritional status Physical activity Advanced directives List of other physicians Hospitalizations, surgeries, and ER visits in previous 12 months Vitals Screenings to include cognitive, depression, and falls Referrals and appointments  In addition, I have reviewed and discussed with patient certain preventive protocols, quality metrics, and best practice recommendations. A written personalized care plan for preventive services as well as general preventive health recommendations were provided to patient.     Daphane Shepherd, LPN   [id number]/29/[id number]   Nurse Notes: Due Tdap Vaccine

## 2022-06-12 ENCOUNTER — Ambulatory Visit
Admission: RE | Admit: 2022-06-12 | Discharge: 2022-06-12 | Disposition: A | Discharge status: Home / Self Care | Payer: Medicare Other | Source: Ambulatory Visit | Attending: Nurse Practitioner | Admitting: Nurse Practitioner

## 2022-06-12 DIAGNOSIS — Z1231 Encounter for screening mammogram for malignant neoplasm of breast: Secondary | ICD-10-CM | POA: Diagnosis not present

## 2022-08-21 ENCOUNTER — Ambulatory Visit (INDEPENDENT_AMBULATORY_CARE_PROVIDER_SITE_OTHER): Payer: 59 | Admitting: Nurse Practitioner

## 2022-08-21 ENCOUNTER — Encounter: Payer: Self-pay | Admitting: Nurse Practitioner

## 2022-08-21 VITALS — BP 117/73 | HR 94 | Temp 96.8°F | Resp 20 | Ht 69.0 in | Wt 201.0 lb

## 2022-08-21 DIAGNOSIS — Z23 Encounter for immunization: Secondary | ICD-10-CM

## 2022-08-21 DIAGNOSIS — E785 Hyperlipidemia, unspecified: Secondary | ICD-10-CM

## 2022-08-21 DIAGNOSIS — E119 Type 2 diabetes mellitus without complications: Secondary | ICD-10-CM

## 2022-08-21 DIAGNOSIS — Z6828 Body mass index (BMI) 28.0-28.9, adult: Secondary | ICD-10-CM

## 2022-08-21 DIAGNOSIS — E559 Vitamin D deficiency, unspecified: Secondary | ICD-10-CM | POA: Diagnosis not present

## 2022-08-21 LAB — BAYER DCA HB A1C WAIVED: HB A1C (BAYER DCA - WAIVED): 6.6 % — ABNORMAL HIGH (ref 4.8–5.6)

## 2022-08-21 LAB — LIPID PANEL

## 2022-08-21 MED ORDER — ATORVASTATIN CALCIUM 40 MG PO TABS: 40.0000 mg | ORAL_TABLET | Freq: Every day | ORAL | 1 refills | Status: DC

## 2022-08-21 MED ORDER — METFORMIN HCL 1000 MG PO TABS: 1000.0000 mg | ORAL_TABLET | Freq: Two times a day (BID) | ORAL | 1 refills | Status: DC

## 2022-08-21 NOTE — Patient Instructions (Signed)

## 2022-08-21 NOTE — Progress Notes (Signed)
Subjective:    Patient ID: Lindsey Phillips, female    DOB: [birth year], 72 y.o.   MRN: [id number]   Chief Complaint: medical management of chronic issues     HPI:  Lindsey Phillips is a 72 y.o. who identifies as a female who was assigned female at birth.   Social history: Lives with: lives by herself and talks to friends or family daily. Work history: retired   Scientist, forensic in today for follow up of the following chronic medical issues:  1. Hyperlipidemia with target LDL less than 100 Does try to watch diet but does nit do much exercise. Lab Results  Component Value Date   CHOL 180 02/16/2022   HDL 33 (L) 02/16/2022   LDLCALC 121 (H) 02/16/2022   TRIG 142 02/16/2022   CHOLHDL 5.5 (H) 02/16/2022     2. Type 2 diabetes mellitus without complication, without long-term current use of insulin (HCC) Fasting blood sugars are running around 120-140. NO low blood sugars' Lab Results  Component Value Date   HGBA1C 5.9 (H) 02/16/2022     3. Vitamin D deficiency Is on daily vitamin d supplement  4. BMI 28.0-28.9,adult No recent weight changes Wt Readings from Last 3 Encounters:  08/21/22 201 lb (91.2 kg)  05/31/22 200 lb (90.7 kg)  02/16/22 201 lb (91.2 kg)   BMI Readings from Last 3 Encounters:  08/21/22 29.68 kg/m  05/31/22 29.53 kg/m  02/16/22 30.56 kg/m      New complaints: None today  No Known Allergies Outpatient Encounter Medications as of 08/21/2022  Medication Sig   Accu-Chek Softclix Lancets lancets SMARTSIG:Topical 1-4 Times Daily   atorvastatin (LIPITOR) 40 MG tablet Take 1 tablet (40 mg total) by mouth daily.   blood glucose meter kit and supplies Dispense based on patient and insurance preference. Use up to four times daily as directed. (FOR ICD-10 E10.9, E11.9).   calcium carbonate (OS-CAL) 1250 (500 Ca) MG chewable tablet Chew 1 tablet by mouth daily.   Cholecalciferol (VITAMIN D3) 1000 units CAPS Take 1 capsule by mouth daily.   glucose blood (ACCU-CHEK  GUIDE) test strip TEST BLOOD SUGAR 4 TIMES DAILY DX E11.65   metFORMIN (GLUCOPHAGE) 1000 MG tablet Take 1 tablet (1,000 mg total) by mouth 2 (two) times daily with a meal.   Facility-Administered Encounter Medications as of 08/21/2022  Medication   0.9 %  sodium chloride infusion    Past Surgical History:  Procedure Laterality Date   ABDOMINAL HYSTERECTOMY     CATARACT EXTRACTION  03/2021   KNEE ARTHROSCOPY Right 07/03/2016   Procedure: RIGHT KNEE ARTHROSCOPY;  Surgeon: Carole Civil, MD;  Location: [facility];  Service: Orthopedics;  Laterality: Right;    Family History  Problem Relation Age of Onset   Hypertension Mother    Diabetes Mother    Heart disease Mother    Kidney disease Mother    Diabetes Father    Hypertension Sister    Hypertension Sister    Diabetes Brother    Hypertension Brother    Diabetes Brother    Hypertension Brother    Diabetes Brother    Hypertension Brother    Diabetes Brother    Kidney disease Brother    Colon cancer Neg Hx    Breast cancer Neg Hx       Controlled substance contract: n/a     Review of Systems  Constitutional:  Negative for diaphoresis.  Eyes:  Negative for pain.  Respiratory:  Negative for shortness of  breath.   Cardiovascular:  Negative for chest pain, palpitations and leg swelling.  Gastrointestinal:  Negative for abdominal pain.  Endocrine: Negative for polydipsia.  Skin:  Negative for rash.  Neurological:  Negative for dizziness, weakness and headaches.  Hematological:  Does not bruise/bleed easily.  All other systems reviewed and are negative.      Objective:   Physical Exam Vitals and nursing note reviewed.  Constitutional:      General: She is not in acute distress.    Appearance: Normal appearance. She is well-developed.  HENT:     Head: Normocephalic.     Right Ear: Tympanic membrane normal.     Left Ear: Tympanic membrane normal.     Nose: Nose normal.     Mouth/Throat:     Mouth: Mucous  membranes are moist.  Eyes:     Pupils: Pupils are equal, round, and reactive to light.  Neck:     Vascular: No carotid bruit or JVD.  Cardiovascular:     Rate and Rhythm: Normal rate and regular rhythm.     Heart sounds: Normal heart sounds.  Pulmonary:     Effort: Pulmonary effort is normal. No respiratory distress.     Breath sounds: Normal breath sounds. No wheezing or rales.  Chest:     Chest wall: No tenderness.  Abdominal:     General: Bowel sounds are normal. There is no distension or abdominal bruit.     Palpations: Abdomen is soft. There is no hepatomegaly, splenomegaly, mass or pulsatile mass.     Tenderness: There is no abdominal tenderness.  Musculoskeletal:        General: Normal range of motion.     Cervical back: Normal range of motion and neck supple.  Lymphadenopathy:     Cervical: No cervical adenopathy.  Skin:    General: Skin is warm and dry.  Neurological:     Mental Status: She is alert and oriented to person, place, and time.     Deep Tendon Reflexes: Reflexes are normal and symmetric.  Psychiatric:        Behavior: Behavior normal.        Thought Content: Thought content normal.        Judgment: Judgment normal.    BP 117/73   Pulse 94   Temp (!) 96.8 F (36 C) (Temporal)   Resp 20   Ht 5\' 9"  (1.753 m)   Wt 201 lb (91.2 kg)   SpO2 98%   BMI 29.68 kg/m   Hgba1c discussed at appointment 6.6%      Assessment & Plan:   Lindsey Phillips comes in today with chief complaint of Medical Management of Chronic Issues   Diagnosis and orders addressed:  1. Hyperlipidemia with target LDL less than 100 Low fat diet - Lipid panel - atorvastatin (LIPITOR) 40 MG tablet; Take 1 tablet (40 mg total) by mouth daily.  Dispense: 90 tablet; Refill: 1  2. Type 2 diabetes mellitus without complication, without long-term current use of insulin (HCC) Continue to watch carbs in diet - Bayer DCA Hb A1c Waived - CBC with Differential/Platelet - CMP14+EGFR -  metFORMIN (GLUCOPHAGE) 1000 MG tablet; Take 1 tablet (1,000 mg total) by mouth 2 (two) times daily with a meal.  Dispense: 180 tablet; Refill: 1  3. Vitamin D deficiency Continue vitamin d supplement  4. BMI 28.0-28.9,adult Discussed diet and exercise for person with BMI >25 Will recheck weight in 3-6 months    Labs pending Health Maintenance  reviewed Diet and exercise encouraged  Follow up plan: 6 months   [place], FNP

## 2022-08-21 NOTE — Addendum Note (Signed)
Addended by: Rolena Infante on: 08/21/2022 01:50 PM   Modules accepted: Orders

## 2022-08-22 LAB — CBC WITH DIFFERENTIAL/PLATELET
Basophils Absolute: 0 10*3/uL (ref 0.0–0.2)
Basos: 1 %
EOS (ABSOLUTE): 0.1 10*3/uL (ref 0.0–0.4)
Eos: 1 %
Hematocrit: 36.4 % (ref 34.0–46.6)
Hemoglobin: 11.5 g/dL (ref 11.1–15.9)
Immature Grans (Abs): 0 10*3/uL (ref 0.0–0.1)
Immature Granulocytes: 0 %
Lymphocytes Absolute: 2.5 10*3/uL (ref 0.7–3.1)
Lymphs: 42 %
MCH: 26 pg — ABNORMAL LOW (ref 26.6–33.0)
MCHC: 31.6 g/dL (ref 31.5–35.7)
MCV: 82 fL (ref 79–97)
Monocytes Absolute: 0.3 10*3/uL (ref 0.1–0.9)
Monocytes: 5 %
Neutrophils Absolute: 3 10*3/uL (ref 1.4–7.0)
Neutrophils: 51 %
Platelets: 284 10*3/uL (ref 150–450)
RBC: 4.43 x10E6/uL (ref 3.77–5.28)
RDW: 14.9 % (ref 11.7–15.4)
WBC: 5.9 10*3/uL (ref 3.4–10.8)

## 2022-08-22 LAB — CMP14+EGFR
ALT: 13 IU/L (ref 0–32)
AST: 16 IU/L (ref 0–40)
Albumin/Globulin Ratio: 1.4 (ref 1.2–2.2)
Albumin: 4.1 g/dL (ref 3.8–4.8)
Alkaline Phosphatase: 115 IU/L (ref 44–121)
BUN/Creatinine Ratio: 18 (ref 12–28)
BUN: 17 mg/dL (ref 8–27)
Bilirubin Total: 0.3 mg/dL (ref 0.0–1.2)
CO2: 23 mmol/L (ref 20–29)
Calcium: 9.8 mg/dL (ref 8.7–10.3)
Chloride: 105 mmol/L (ref 96–106)
Creatinine, Ser: 0.95 mg/dL (ref 0.57–1.00)
Globulin, Total: 3 g/dL (ref 1.5–4.5)
Glucose: 136 mg/dL — ABNORMAL HIGH (ref 70–99)
Potassium: 4.5 mmol/L (ref 3.5–5.2)
Sodium: 143 mmol/L (ref 134–144)
Total Protein: 7.1 g/dL (ref 6.0–8.5)
eGFR: 64 mL/min/{1.73_m2} (ref >59)

## 2022-08-22 LAB — LIPID PANEL
Chol/HDL Ratio: 4.7 ratio — ABNORMAL HIGH (ref 0.0–4.4)
Cholesterol, Total: 154 mg/dL (ref 100–199)
HDL: 33 mg/dL — ABNORMAL LOW (ref >39)
LDL Chol Calc (NIH): 100 mg/dL — ABNORMAL HIGH (ref 0–99)
Triglycerides: 116 mg/dL (ref 0–149)
VLDL Cholesterol Cal: 21 mg/dL (ref 5–40)

## 2023-02-19 ENCOUNTER — Ambulatory Visit (INDEPENDENT_AMBULATORY_CARE_PROVIDER_SITE_OTHER): Payer: 59 | Admitting: Nurse Practitioner

## 2023-02-19 ENCOUNTER — Encounter: Payer: Self-pay | Admitting: Nurse Practitioner

## 2023-02-19 VITALS — BP 128/58 | HR 61 | Temp 97.1°F | Resp 20 | Ht 69.0 in | Wt 206.0 lb

## 2023-02-19 DIAGNOSIS — Z7984 Long term (current) use of oral hypoglycemic drugs: Secondary | ICD-10-CM | POA: Diagnosis not present

## 2023-02-19 DIAGNOSIS — E559 Vitamin D deficiency, unspecified: Secondary | ICD-10-CM | POA: Diagnosis not present

## 2023-02-19 DIAGNOSIS — E1169 Type 2 diabetes mellitus with other specified complication: Secondary | ICD-10-CM | POA: Diagnosis not present

## 2023-02-19 DIAGNOSIS — Z23 Encounter for immunization: Secondary | ICD-10-CM

## 2023-02-19 DIAGNOSIS — E119 Type 2 diabetes mellitus without complications: Secondary | ICD-10-CM

## 2023-02-19 DIAGNOSIS — Z6828 Body mass index (BMI) 28.0-28.9, adult: Secondary | ICD-10-CM | POA: Diagnosis not present

## 2023-02-19 DIAGNOSIS — E785 Hyperlipidemia, unspecified: Secondary | ICD-10-CM

## 2023-02-19 LAB — CBC WITH DIFFERENTIAL/PLATELET
Basophils Absolute: 0 10*3/uL (ref 0.0–0.2)
Basos: 0 %
EOS (ABSOLUTE): 0.1 10*3/uL (ref 0.0–0.4)
Eos: 1 %
Hematocrit: 39.1 % (ref 34.0–46.6)
Hemoglobin: 12.2 g/dL (ref 11.1–15.9)
Immature Grans (Abs): 0 10*3/uL (ref 0.0–0.1)
Immature Granulocytes: 0 %
Lymphocytes Absolute: 2.6 10*3/uL (ref 0.7–3.1)
Lymphs: 44 %
MCH: 25.4 pg — ABNORMAL LOW (ref 26.6–33.0)
MCHC: 31.2 g/dL — ABNORMAL LOW (ref 31.5–35.7)
MCV: 82 fL (ref 79–97)
Monocytes Absolute: 0.4 10*3/uL (ref 0.1–0.9)
Monocytes: 8 %
Neutrophils Absolute: 2.7 10*3/uL (ref 1.4–7.0)
Neutrophils: 47 %
Platelets: 290 10*3/uL (ref 150–450)
RBC: 4.8 x10E6/uL (ref 3.77–5.28)
RDW: 14.6 % (ref 11.7–15.4)
WBC: 5.8 10*3/uL (ref 3.4–10.8)

## 2023-02-19 LAB — LIPID PANEL
Chol/HDL Ratio: 5.1 ratio — ABNORMAL HIGH (ref 0.0–4.4)
Cholesterol, Total: 184 mg/dL (ref 100–199)
HDL: 36 mg/dL — ABNORMAL LOW (ref >39)
LDL Chol Calc (NIH): 124 mg/dL — ABNORMAL HIGH (ref 0–99)
Triglycerides: 133 mg/dL (ref 0–149)
VLDL Cholesterol Cal: 24 mg/dL (ref 5–40)

## 2023-02-19 LAB — CMP14+EGFR
ALT: 10 IU/L (ref 0–32)
AST: 16 IU/L (ref 0–40)
Albumin: 4.1 g/dL (ref 3.8–4.8)
Alkaline Phosphatase: 122 IU/L — ABNORMAL HIGH (ref 44–121)
BUN/Creatinine Ratio: 19 (ref 12–28)
BUN: 21 mg/dL (ref 8–27)
Bilirubin Total: 0.3 mg/dL (ref 0.0–1.2)
CO2: 24 mmol/L (ref 20–29)
Calcium: 10 mg/dL (ref 8.7–10.3)
Chloride: 105 mmol/L (ref 96–106)
Creatinine, Ser: 1.08 mg/dL — ABNORMAL HIGH (ref 0.57–1.00)
Globulin, Total: 2.9 g/dL (ref 1.5–4.5)
Glucose: 106 mg/dL — ABNORMAL HIGH (ref 70–99)
Potassium: 4.7 mmol/L (ref 3.5–5.2)
Sodium: 141 mmol/L (ref 134–144)
Total Protein: 7 g/dL (ref 6.0–8.5)
eGFR: 55 mL/min/{1.73_m2} — ABNORMAL LOW (ref >59)

## 2023-02-19 LAB — BAYER DCA HB A1C WAIVED: HB A1C (BAYER DCA - WAIVED): 6.7 % — ABNORMAL HIGH (ref 4.8–5.6)

## 2023-02-19 MED ORDER — METFORMIN HCL 1000 MG PO TABS: 1000.0000 mg | ORAL_TABLET | Freq: Two times a day (BID) | ORAL | 1 refills | Status: DC

## 2023-02-19 MED ORDER — ATORVASTATIN CALCIUM 40 MG PO TABS: 40.0000 mg | ORAL_TABLET | Freq: Every day | ORAL | 1 refills | Status: DC

## 2023-02-19 NOTE — Patient Instructions (Signed)

## 2023-02-19 NOTE — Progress Notes (Signed)
Subjective:    Patient ID: Lindsey Phillips, female    DOB: May 28, 1951, 72 y.o.   MRN: [id number]   Chief Complaint: medical management of chronic issues     HPI:  Lindsey Phillips is a 72 y.o. who identifies as a female who was assigned female at birth.   Social history: Lives with: by herself- friends and family check on her daily Work history: retired   Water engineer in today for follow up of the following chronic medical issues:  1. Diabetes mellitus treated with oral medication (HCC) Fasting blood sugars are running around 120-140. Denies any low blood sugars. Lab Results  Component Value Date   HGBA1C 6.6 (H) 08/21/2022     2. Hyperlipidemia associated with type 2 diabetes mellitus (HCC) Does try to watch diet but does no dedicated exercise Lab Results  Component Value Date   CHOL 154 08/21/2022   HDL 33 (L) 08/21/2022   LDLCALC 100 (H) 08/21/2022   TRIG 116 08/21/2022   CHOLHDL 4.7 (H) 08/21/2022     3. Vitamin D deficiency Is on daily vitamin d supplement Last vitamin D Lab Results  Component Value Date   VD25OH 36.2 02/16/2022     4. BMI 28.0-28.9,adult Weight is up 5lbs Wt Readings from Last 3 Encounters:  02/19/23 206 lb (93.4 kg)  08/21/22 201 lb (91.2 kg)  05/31/22 200 lb (90.7 kg)   BMI Readings from Last 3 Encounters:  02/19/23 30.42 kg/m  08/21/22 29.68 kg/m  05/31/22 29.53 kg/m       New complaints: None today  No Known Allergies Outpatient Encounter Medications as of 02/19/2023  Medication Sig   Accu-Chek Softclix Lancets lancets SMARTSIG:Topical 1-4 Times Daily   atorvastatin (LIPITOR) 40 MG tablet Take 1 tablet (40 mg total) by mouth daily.   blood glucose meter kit and supplies Dispense based on patient and insurance preference. Use up to four times daily as directed. (FOR ICD-10 E10.9, E11.9).   calcium carbonate (OS-CAL) 1250 (500 Ca) MG chewable tablet Chew 1 tablet by mouth daily.   Cholecalciferol (VITAMIN D3) 1000 units CAPS Take  1 capsule by mouth daily.   glucose blood (ACCU-CHEK GUIDE) test strip TEST BLOOD SUGAR 4 TIMES DAILY DX E11.65   metFORMIN (GLUCOPHAGE) 1000 MG tablet Take 1 tablet (1,000 mg total) by mouth 2 (two) times daily with a meal.   Facility-Administered Encounter Medications as of 02/19/2023  Medication   0.9 %  sodium chloride infusion    Past Surgical History:  Procedure Laterality Date   ABDOMINAL HYSTERECTOMY     CATARACT EXTRACTION  03/2021   KNEE ARTHROSCOPY Right 07/03/2016   Procedure: RIGHT KNEE ARTHROSCOPY;  Surgeon: Vickki Hearing, MD;  Location: [facility];  Service: Orthopedics;  Laterality: Right;    Family History  Problem Relation Age of Onset   Hypertension Mother    Diabetes Mother    Heart disease Mother    Kidney disease Mother    Diabetes Father    Hypertension Sister    Hypertension Sister    Diabetes Brother    Hypertension Brother    Diabetes Brother    Hypertension Brother    Diabetes Brother    Hypertension Brother    Diabetes Brother    Kidney disease Brother    Colon cancer Neg Hx    Breast cancer Neg Hx       Controlled substance contract: n/a     Review of Systems  Constitutional:  Negative for diaphoresis.  Eyes:  Negative for pain.  Respiratory:  Negative for shortness of breath.   Cardiovascular:  Negative for chest pain, palpitations and leg swelling.  Gastrointestinal:  Negative for abdominal pain.  Endocrine: Negative for polydipsia.  Skin:  Negative for rash.  Neurological:  Negative for dizziness, weakness and headaches.  Hematological:  Does not bruise/bleed easily.  All other systems reviewed and are negative.      Objective:   Physical Exam Vitals and nursing note reviewed.  Constitutional:      General: She is not in acute distress.    Appearance: Normal appearance. She is well-developed.  HENT:     Head: Normocephalic.     Right Ear: Tympanic membrane normal.     Left Ear: Tympanic membrane normal.     Nose: Nose  normal.     Mouth/Throat:     Mouth: Mucous membranes are moist.  Eyes:     Pupils: Pupils are equal, round, and reactive to light.  Neck:     Vascular: No carotid bruit or JVD.  Cardiovascular:     Rate and Rhythm: Normal rate and regular rhythm.     Heart sounds: Normal heart sounds.  Pulmonary:     Effort: Pulmonary effort is normal. No respiratory distress.     Breath sounds: Normal breath sounds. No wheezing or rales.  Chest:     Chest wall: No tenderness.  Abdominal:     General: Bowel sounds are normal. There is no distension or abdominal bruit.     Palpations: Abdomen is soft. There is no hepatomegaly, splenomegaly, mass or pulsatile mass.     Tenderness: There is no abdominal tenderness.  Musculoskeletal:        General: Normal range of motion.     Cervical back: Normal range of motion and neck supple.  Lymphadenopathy:     Cervical: No cervical adenopathy.  Skin:    General: Skin is warm and dry.  Neurological:     Mental Status: She is alert and oriented to person, place, and time.     Deep Tendon Reflexes: Reflexes are normal and symmetric.  Psychiatric:        Behavior: Behavior normal.        Thought Content: Thought content normal.        Judgment: Judgment normal.    BP (!) 128/58   Pulse 61   Temp (!) 97.1 F (36.2 C) (Temporal)   Resp 20   Ht 5\' 9"  (1.753 m)   Wt 206 lb (93.4 kg)   SpO2 98%   BMI 30.42 kg/m   HGBA1c 6.7%       Assessment & Plan:   Lindsey Phillips comes in today with chief complaint of Medical Management of Chronic Issues   Diagnosis and orders addressed:  1. Diabetes mellitus treated with oral medication (HCC) Continue to watch carbs in diet - Bayer DCA Hb A1c Waived - Microalbumin / creatinine urine ratio - metFORMIN (GLUCOPHAGE) 1000 MG tablet; Take 1 tablet (1,000 mg total) by mouth 2 (two) times daily with a meal.  Dispense: 180 tablet; Refill: 1  2. Hyperlipidemia associated with type 2 diabetes mellitus (HCC) Low  fat diet - CBC with Differential/Platelet - CMP14+EGFR - Lipid panel - atorvastatin (LIPITOR) 40 MG tablet; Take 1 tablet (40 mg total) by mouth daily.  Dispense: 90 tablet; Refill: 1  3. Vitamin D deficiency Continue daily vitamin d supplement  4. BMI 28.0-28.9,adult Discussed diet and exercise for person with BMI >25  Will recheck weight in 3-6 months    Labs pending Health Maintenance reviewed Diet and exercise encouraged  Follow up plan: 6 months   Mary-Margaret Daphine Deutscher, FNP

## 2023-02-21 NOTE — Addendum Note (Signed)
Addended by: Cleda Daub on: 02/21/2023 08:54 AM   Modules accepted: Orders

## 2023-02-21 NOTE — Progress Notes (Signed)
Patient r/c  

## 2023-04-28 ENCOUNTER — Other Ambulatory Visit: Payer: Self-pay | Admitting: Nurse Practitioner

## 2023-04-28 DIAGNOSIS — E119 Type 2 diabetes mellitus without complications: Secondary | ICD-10-CM

## 2023-06-04 ENCOUNTER — Ambulatory Visit (INDEPENDENT_AMBULATORY_CARE_PROVIDER_SITE_OTHER): Payer: 59

## 2023-06-04 VITALS — Ht 69.0 in | Wt 206.0 lb

## 2023-06-04 DIAGNOSIS — Z0000 Encounter for general adult medical examination without abnormal findings: Secondary | ICD-10-CM

## 2023-06-04 NOTE — Progress Notes (Signed)
Subjective:   Lindsey Phillips is a 72 y.o. female who presents for Medicare Annual (Subsequent) preventive examination.  Visit Complete: Virtual I connected with  Clearence Ped on 06/04/23 by a audio enabled telemedicine application and verified that I am speaking with the correct person using two identifiers.  Patient Location: Home  Provider Location: Home Office  I discussed the limitations of evaluation and management by telemedicine. The patient expressed understanding and agreed to proceed.  Vital Signs: Because this visit was a virtual/telehealth visit, some criteria may be missing or patient reported. Any vitals not documented were not able to be obtained and vitals that have been documented are patient reported.  Patient Medicare AWV questionnaire was completed by the patient on 06/04/2023; I have confirmed that all information answered by patient is correct and no changes since this date.  Cardiac Risk Factors include: advanced age (>63men, >22 women);diabetes mellitus;dyslipidemia     Objective:    Today's Vitals   06/04/23 0901  Weight: 206 lb (93.4 kg)  Height: 5\' 9"  (1.753 m)   Body mass index is 30.42 kg/m.     06/04/2023    9:06 AM 05/31/2022   10:49 AM 05/30/2021   10:45 AM 05/27/2020   11:02 AM 05/27/2019   10:41 AM 05/14/2018    2:26 PM 02/28/2017    9:58 AM  Advanced Directives  Does Patient Have a Medical Advance Directive? No No No No No No No  Would patient like information on creating a medical advance directive? Yes (MAU/Ambulatory/Procedural Areas - Information given) No - Patient declined No - Patient declined No - Patient declined No - Patient declined No - Patient declined     Current Medications (verified) Outpatient Encounter Medications as of 06/04/2023  Medication Sig   Accu-Chek Softclix Lancets lancets SMARTSIG:Topical 1-4 Times Daily   atorvastatin (LIPITOR) 40 MG tablet Take 1 tablet (40 mg total) by mouth daily.   blood glucose meter  kit and supplies Dispense based on patient and insurance preference. Use up to four times daily as directed. (FOR ICD-10 E10.9, E11.9).   calcium carbonate (OS-CAL) 1250 (500 Ca) MG chewable tablet Chew 1 tablet by mouth daily.   Cholecalciferol (VITAMIN D3) 1000 units CAPS Take 1 capsule by mouth daily.   glucose blood (ACCU-CHEK GUIDE) test strip TEST BLOOD SUGAR 4 TIMES DAILY DX E11.65   metFORMIN (GLUCOPHAGE) 1000 MG tablet Take 1 tablet (1,000 mg total) by mouth 2 (two) times daily with a meal.   Facility-Administered Encounter Medications as of 06/04/2023  Medication   0.9 %  sodium chloride infusion    Allergies (verified) Patient has no known allergies.   History: Past Medical History:  Diagnosis Date   Arthritis    Hyperlipidemia    Vitamin D deficiency    Past Surgical History:  Procedure Laterality Date   ABDOMINAL HYSTERECTOMY     CATARACT EXTRACTION  03/2021   KNEE ARTHROSCOPY Right 07/03/2016   Procedure: RIGHT KNEE ARTHROSCOPY;  Surgeon: Vickki Hearing, MD;  Location: [facility];  Service: Orthopedics;  Laterality: Right;   Family History  Problem Relation Age of Onset   Hypertension Mother    Diabetes Mother    Heart disease Mother    Kidney disease Mother    Diabetes Father    Hypertension Sister    Hypertension Sister    Diabetes Brother    Hypertension Brother    Diabetes Brother    Hypertension Brother    Diabetes Brother  Hypertension Brother    Diabetes Brother    Kidney disease Brother    Colon cancer Neg Hx    Breast cancer Neg Hx    Social History   Socioeconomic History   Marital status: Divorced    Spouse name: Not on file   Number of children: 1   Years of education: 11   Highest education level: 11th grade  Occupational History   Occupation: retired  Tobacco Use   Smoking status: Former    Current packs/day: 0.00    Average packs/day: 0.3 packs/day for 15.0 years (3.8 ttl pk-yrs)    Types: Cigarettes    Start date: 07/17/1998     Quit date: 07/17/2013    Years since quitting: 9.8   Smokeless tobacco: Never  Vaping Use   Vaping status: Never Used  Substance and Sexual Activity   Alcohol use: No   Drug use: No   Sexual activity: Not Currently    Birth control/protection: Surgical  Other Topics Concern   Not on file  Social History Narrative   Lives alone. Sister next door   Visits her aunt in nursing home twice per week   Daughter in [place]   Social Determinants of Health   Financial Resource Strain: Low Risk  (06/04/2023)   Overall Financial Resource Strain (CARDIA)    Difficulty of Paying Living Expenses: Not hard at all  Food Insecurity: No Food Insecurity (06/04/2023)   Hunger Vital Sign    Worried About Running Out of Food in the Last Year: Never true    Ran Out of Food in the Last Year: Never true  Transportation Needs: No Transportation Needs (06/04/2023)   PRAPARE - Administrator, Civil Service (Medical): No    Lack of Transportation (Non-Medical): No  Physical Activity: Insufficiently Active (06/04/2023)   Exercise Vital Sign    Days of Exercise per Week: 3 days    Minutes of Exercise per Session: 30 min  Stress: No Stress Concern Present (06/04/2023)   [employer] of Occupational Health - Occupational Stress Questionnaire    Feeling of Stress : Not at all  Social Connections: Moderately Isolated (06/04/2023)   Social Connection and Isolation Panel [NHANES]    Frequency of Communication with Friends and Family: More than three times a week    Frequency of Social Gatherings with Friends and Family: More than three times a week    Attends Religious Services: More than 4 times per year    Active Member of [employer] or Organizations: No    Attends Engineer, structural: Never    Marital Status: Divorced    Tobacco Counseling Counseling given: Not Answered   Clinical Intake:  Pre-visit preparation completed: Yes  Pain : No/denies pain     Nutritional Risks:  None Diabetes: Yes CBG done?: No Did pt. bring in CBG monitor from home?: No  How often do you need to have someone help you when you read instructions, pamphlets, or other written materials from your doctor or pharmacy?: 1 - Never  Interpreter Needed?: No  Information entered by :: Renie Ora, LPN   Activities of Daily Living    06/04/2023    9:06 AM  In your present state of health, do you have any difficulty performing the following activities:  Hearing? 0  Vision? 0  Difficulty concentrating or making decisions? 0  Walking or climbing stairs? 0  Dressing or bathing? 0  Doing errands, shopping? 0  Preparing Food and eating ?  N  Using the Toilet? N  In the past six months, have you accidently leaked urine? N  Do you have problems with loss of bowel control? N  Managing your Medications? N  Managing your Finances? N  Housekeeping or managing your Housekeeping? N    Patient Care Team: Bennie Pierini, FNP as PCP - General (Nurse Practitioner) Vickki Hearing, MD as Consulting Physician (Orthopedic Surgery)  Indicate any recent Medical Services you may have received from other than [facility] providers in the past year (date may be approximate).     Assessment:   This is a routine wellness examination for Lindsey Phillips.  Hearing/Vision screen Vision Screening - Comments:: Patient will call to schedule with Dr. Laural Benes    Goals Addressed             This Visit's Progress    DIET - INCREASE WATER INTAKE   On track    Try to drink 6-8 glasses of water daily       Depression Screen    06/04/2023    9:04 AM 02/19/2023   10:39 AM 08/21/2022    9:52 AM 05/31/2022   10:48 AM 02/16/2022   10:01 AM 08/18/2021   10:28 AM 05/30/2021   10:38 AM  PHQ 2/9 Scores  PHQ - 2 Score 0 0 0 0 0 0 0  PHQ- 9 Score  1 2  0 0 0    Fall Risk    06/04/2023    9:02 AM 02/19/2023   10:39 AM 08/21/2022    9:52 AM 05/31/2022   10:46 AM 02/16/2022   10:00 AM  Fall Risk   Falls in the  past year? 0 0 0 0 0  Number falls in past yr: 0   0   Injury with Fall? 0   0   Risk for fall due to : No Fall Risks   No Fall Risks   Follow up Falls prevention discussed   Falls prevention discussed     MEDICARE RISK AT HOME: Medicare Risk at Home Any stairs in or around the home?: No If so, are there any without handrails?: No Home free of loose throw rugs in walkways, pet beds, electrical cords, etc?: Yes Adequate lighting in your home to reduce risk of falls?: Yes Life alert?: No Use of a cane, walker or w/c?: No Grab bars in the bathroom?: No Shower chair or bench in shower?: No Elevated toilet seat or a handicapped toilet?: No  TIMED UP AND GO:  Was the test performed?  No    Cognitive Function:    05/14/2018    5:05 PM 12/27/2016   10:29 AM  MMSE - Mini Mental State Exam  Orientation to time 5 5  Orientation to Place 5 5  Registration 3 3  Attention/ Calculation 5 2  Recall 2 0  Language- name 2 objects 2 2  Language- repeat 1 1  Language- follow 3 step command 3 3  Language- read & follow direction 1 1  Write a sentence 1 1  Copy design 1 0  Total score 29 23        06/04/2023    9:07 AM 05/31/2022   10:50 AM 05/30/2021   10:40 AM 05/27/2020   11:06 AM 05/27/2019   10:44 AM  6CIT Screen  What Year? 0 points 0 points 0 points 0 points 0 points  What month? 0 points 0 points 0 points 0 points 0 points  What time? 0 points 0 points  0 points 0 points 0 points  Count back from 20 0 points 0 points 0 points 0 points 0 points  Months in reverse 0 points 0 points 0 points 0 points 0 points  Repeat phrase 0 points 0 points 2 points 0 points 4 points  Total Score 0 points 0 points 2 points 0 points 4 points    Immunizations Immunization History  Administered Date(s) Administered   Fluad Quad(high Dose 65+) 04/30/2021   Influenza, High Dose Seasonal PF 05/07/2017, 04/25/2020   Influenza, Quadrivalent, Recombinant, Inj, Pf 04/21/2019   Influenza,inj,Quad  PF,6+ Mos 04/16/2018   Influenza-Unspecified 04/16/2016, 05/07/2017, 04/25/2020   Moderna Covid-19 Fall Seasonal Vaccine 82yrs & older 05/10/2023   Moderna SARS-COV2 Booster Vaccination 12/16/2020   Moderna Sars-Covid-2 Vaccination 08/26/2019, 09/23/2019, 12/17/2019, 05/11/2020   Pneumococcal Conjugate-13 11/18/2015   Pneumococcal Polysaccharide-23 12/27/2016   Tdap 02/19/2023   Zoster Recombinant(Shingrix) 02/16/2022, 08/21/2022    TDAP status: Up to date  Flu Vaccine status: Up to date  Pneumococcal vaccine status: Up to date  Covid-19 vaccine status: Information provided on how to obtain vaccines.   Qualifies for Shingles Vaccine? Yes   Zostavax completed Yes   Shingrix Completed?: Yes  Screening Tests Health Maintenance  Topic Date Due   OPHTHALMOLOGY EXAM  02/14/2022   MAMMOGRAM  06/13/2023   FOOT EXAM  08/22/2023   HEMOGLOBIN A1C  08/22/2023   COVID-19 Vaccine (7 - 2023-24 season) 09/10/2023   DEXA SCAN  02/17/2024   Diabetic kidney evaluation - eGFR measurement  02/19/2024   Diabetic kidney evaluation - Urine ACR  02/19/2024   Medicare Annual Wellness (AWV)  06/03/2024   Colonoscopy  03/15/2027   DTaP/Tdap/Td (2 - Td or Tdap) 02/18/2033   Pneumonia Vaccine 72+ Years old  Completed   INFLUENZA VACCINE  Completed   Hepatitis C Screening  Completed   Zoster Vaccines- Shingrix  Completed   HPV VACCINES  Aged Out    Health Maintenance  Health Maintenance Due  Topic Date Due   OPHTHALMOLOGY EXAM  02/14/2022    Colorectal cancer screening: Type of screening: Colonoscopy. Completed 03/14/2017. Repeat every 10 years  Mammogram status: Ordered patient to scheduled  06/25/2023. Pt provided with contact info and advised to call to schedule appt.   Bone Density status: Completed 02/16/2022. Results reflect: Bone density results: OSTEOPOROSIS. Repeat every 2 years.  Lung Cancer Screening: (Low Dose CT Chest recommended if Age 52-80 years, 20 pack-year currently  smoking OR have quit w/in 15years.) does not qualify.   Lung Cancer Screening Referral: n/a  Additional Screening:  Hepatitis C Screening: does not qualify; Completed 11/18/2015  Vision Screening: Recommended annual ophthalmology exams for early detection of glaucoma and other disorders of the eye. Is the patient up to date with their annual eye exam?  No  Who is the provider or what is the name of the office in which the patient attends annual eye exams? Dr.Johnson  If pt is not established with a provider, would they like to be referred to a provider to establish care? No .   Dental Screening: Recommended annual dental exams for proper oral hygiene  Diabetic Foot Exam: Diabetic Foot Exam: Overdue, Pt has been advised about the importance in completing this exam. Pt is scheduled for diabetic foot exam on next office visit .  Community Resource Referral / Chronic Care Management: CRR required this visit?  No   CCM required this visit?  No     Plan:     I have  personally reviewed and noted the following in the patient's chart:   Medical and social history Use of alcohol, tobacco or illicit drugs  Current medications and supplements including opioid prescriptions. Patient is not currently taking opioid prescriptions. Functional ability and status Nutritional status Physical activity Advanced directives List of other physicians Hospitalizations, surgeries, and ER visits in previous 12 months Vitals Screenings to include cognitive, depression, and falls Referrals and appointments  In addition, I have reviewed and discussed with patient certain preventive protocols, quality metrics, and best practice recommendations. A written personalized care plan for preventive services as well as general preventive health recommendations were provided to patient.     Lorrene Reid, LPN   16/04/[id number]   After Visit Summary: (MyChart) Due to this being a telephonic visit, the after visit  summary with patients personalized plan was offered to patient via MyChart   Nurse Notes: none

## 2023-06-04 NOTE — Patient Instructions (Signed)
Lindsey Phillips , Thank you for taking time to come for your Medicare Wellness Visit. I appreciate your ongoing commitment to your health goals. Please review the following plan we discussed and let me know if I can assist you in the future.   Referrals/Orders/Follow-Ups/Clinician Recommendations: Aim for 30 minutes of exercise or brisk walking, 6-8 glasses of water, and 5 servings of fruits and vegetables each day.   This is a list of the screening recommended for you and due dates:  Health Maintenance  Topic Date Due   Eye exam for diabetics  02/14/2022   Mammogram  06/13/2023   Complete foot exam   08/22/2023   Hemoglobin A1C  08/22/2023   COVID-19 Vaccine (7 - 2023-24 season) 09/10/2023   DEXA scan (bone density measurement)  02/17/2024   Yearly kidney function blood test for diabetes  02/19/2024   Yearly kidney health urinalysis for diabetes  02/19/2024   Medicare Annual Wellness Visit  06/03/2024   Colon Cancer Screening  03/15/2027   DTaP/Tdap/Td vaccine (2 - Td or Tdap) 02/18/2033   Pneumonia Vaccine  Completed   Flu Shot  Completed   Hepatitis C Screening  Completed   Zoster (Shingles) Vaccine  Completed   HPV Vaccine  Aged Out    Advanced directives: (Provided) Advance directive discussed with you today. I have provided a copy for you to complete at home and have notarized. Once this is complete, please bring a copy in to our office so we can scan it into your chart. Information on Advanced Care Planning can be found at [facility] [place] Advance Health Care Directives Advance Health Care Directives ([email or web address]    Next Medicare Annual Wellness Visit scheduled for next year: Yes  Insert Preventive Care attachment Insert FALL PREVENTION attachment if needed

## 2023-06-19 ENCOUNTER — Telehealth: Payer: Self-pay | Admitting: Nurse Practitioner

## 2023-06-20 ENCOUNTER — Other Ambulatory Visit: Payer: Self-pay | Admitting: Nurse Practitioner

## 2023-06-20 DIAGNOSIS — Z1231 Encounter for screening mammogram for malignant neoplasm of breast: Secondary | ICD-10-CM

## 2023-06-25 ENCOUNTER — Ambulatory Visit
Admission: RE | Admit: 2023-06-25 | Discharge: 2023-06-25 | Disposition: A | Discharge status: Home / Self Care | Payer: 59 | Source: Ambulatory Visit | Attending: Nurse Practitioner | Admitting: Nurse Practitioner

## 2023-06-25 DIAGNOSIS — Z1231 Encounter for screening mammogram for malignant neoplasm of breast: Secondary | ICD-10-CM

## 2023-08-18 ENCOUNTER — Other Ambulatory Visit: Payer: Self-pay | Admitting: Nurse Practitioner

## 2023-08-18 DIAGNOSIS — E119 Type 2 diabetes mellitus without complications: Secondary | ICD-10-CM

## 2023-08-20 ENCOUNTER — Encounter: Payer: Self-pay | Admitting: Nurse Practitioner

## 2023-08-20 ENCOUNTER — Ambulatory Visit (INDEPENDENT_AMBULATORY_CARE_PROVIDER_SITE_OTHER): Payer: 59 | Admitting: Nurse Practitioner

## 2023-08-20 VITALS — BP 108/66 | HR 80 | Temp 96.8°F | Ht 69.0 in | Wt 202.0 lb

## 2023-08-20 DIAGNOSIS — Z6829 Body mass index (BMI) 29.0-29.9, adult: Secondary | ICD-10-CM

## 2023-08-20 DIAGNOSIS — E785 Hyperlipidemia, unspecified: Secondary | ICD-10-CM | POA: Diagnosis not present

## 2023-08-20 DIAGNOSIS — E119 Type 2 diabetes mellitus without complications: Secondary | ICD-10-CM | POA: Diagnosis not present

## 2023-08-20 DIAGNOSIS — E559 Vitamin D deficiency, unspecified: Secondary | ICD-10-CM

## 2023-08-20 DIAGNOSIS — E1169 Type 2 diabetes mellitus with other specified complication: Secondary | ICD-10-CM

## 2023-08-20 DIAGNOSIS — Z7984 Long term (current) use of oral hypoglycemic drugs: Secondary | ICD-10-CM

## 2023-08-20 LAB — BAYER DCA HB A1C WAIVED: HB A1C (BAYER DCA - WAIVED): 6.6 % — ABNORMAL HIGH (ref 4.8–5.6)

## 2023-08-20 MED ORDER — ROSUVASTATIN CALCIUM 20 MG PO TABS: 20.0000 mg | ORAL_TABLET | Freq: Every day | ORAL | 1 refills | Status: DC

## 2023-08-20 MED ORDER — METFORMIN HCL 1000 MG PO TABS: 500.0000 mg | ORAL_TABLET | Freq: Two times a day (BID) | ORAL | 1 refills | Status: DC

## 2023-08-20 NOTE — Addendum Note (Signed)
Addended by: Bennie Pierini on: 08/20/2023 11:15 AM   Modules accepted: Orders

## 2023-08-20 NOTE — Patient Instructions (Signed)

## 2023-08-20 NOTE — Progress Notes (Addendum)
Subjective:    Patient ID: Lindsey Phillips, female    DOB: [birth year], 73 y.o.   MRN: [id number]   Chief Complaint: medical management of chronic issues     HPI:  Lindsey Phillips is a 73 y.o. who identifies as a female who was assigned female at birth.   Social history: Lives with: by herself- friends and family check on her daily Work history: retired   Water engineer in today for follow up of the following chronic medical issues:  1. Diabetes mellitus treated with oral medication (HCC) Fasting blood sugars are running around 120-140. Denies any low blood sugars. Lab Results  Component Value Date   HGBA1C 6.7 (H) 02/19/2023     2. Hyperlipidemia associated with type 2 diabetes mellitus (HCC) Does try to watch diet but does no dedicated exercise Lab Results  Component Value Date   CHOL 184 02/19/2023   HDL 36 (L) 02/19/2023   LDLCALC 124 (H) 02/19/2023   TRIG 133 02/19/2023   CHOLHDL 5.1 (H) 02/19/2023  The 10-year ASCVD risk score (Arnett DK, et al., 2019) is: 16.3%    3. Vitamin D deficiency Is on daily vitamin d supplement Last vitamin D Lab Results  Component Value Date   VD25OH 36.2 02/16/2022     4. BMI 30.0-30.9,adult Weight is down 4lbs  Wt Readings from Last 3 Encounters:  08/20/23 202 lb (91.6 kg)  06/04/23 206 lb (93.4 kg)  02/19/23 206 lb (93.4 kg)   BMI Readings from Last 3 Encounters:  08/20/23 29.83 kg/m  06/04/23 30.42 kg/m  02/19/23 30.42 kg/m        New complaints: None today  No Known Allergies Outpatient Encounter Medications as of 08/20/2023  Medication Sig   Accu-Chek Softclix Lancets lancets SMARTSIG:Topical 1-4 Times Daily   atorvastatin (LIPITOR) 40 MG tablet Take 1 tablet (40 mg total) by mouth daily.   blood glucose meter kit and supplies Dispense based on patient and insurance preference. Use up to four times daily as directed. (FOR ICD-10 E10.9, E11.9).   calcium carbonate (OS-CAL) 1250 (500 Ca) MG chewable tablet Chew 1  tablet by mouth daily.   Cholecalciferol (VITAMIN D3) 1000 units CAPS Take 1 capsule by mouth daily.   glucose blood (ACCU-CHEK GUIDE) test strip TEST BLOOD SUGAR 4 TIMES DAILY DX E11.65   metFORMIN (GLUCOPHAGE) 1000 MG tablet Take 1 tablet (1,000 mg total) by mouth 2 (two) times daily with a meal.   Facility-Administered Encounter Medications as of 08/20/2023  Medication   0.9 %  sodium chloride infusion    Past Surgical History:  Procedure Laterality Date   ABDOMINAL HYSTERECTOMY     CATARACT EXTRACTION  03/2021   KNEE ARTHROSCOPY Right 07/03/2016   Procedure: RIGHT KNEE ARTHROSCOPY;  Surgeon: Vickki Hearing, MD;  Location: [facility];  Service: Orthopedics;  Laterality: Right;    Family History  Problem Relation Age of Onset   Hypertension Mother    Diabetes Mother    Heart disease Mother    Kidney disease Mother    Diabetes Father    Hypertension Sister    Hypertension Sister    Diabetes Brother    Hypertension Brother    Diabetes Brother    Hypertension Brother    Diabetes Brother    Hypertension Brother    Diabetes Brother    Kidney disease Brother    Colon cancer Neg Hx    Breast cancer Neg Hx       Controlled substance contract: n/a  Review of Systems  Constitutional:  Negative for diaphoresis.  Eyes:  Negative for pain.  Respiratory:  Negative for shortness of breath.   Cardiovascular:  Negative for chest pain, palpitations and leg swelling.  Gastrointestinal:  Negative for abdominal pain.  Endocrine: Negative for polydipsia.  Skin:  Negative for rash.  Neurological:  Negative for dizziness, weakness and headaches.  Hematological:  Does not bruise/bleed easily.  All other systems reviewed and are negative.      Objective:   Physical Exam Vitals and nursing note reviewed.  Constitutional:      General: She is not in acute distress.    Appearance: Normal appearance. She is well-developed.  HENT:     Head: Normocephalic.     Right Ear:  Tympanic membrane normal.     Left Ear: Tympanic membrane normal.     Nose: Nose normal.     Mouth/Throat:     Mouth: Mucous membranes are moist.  Eyes:     Pupils: Pupils are equal, round, and reactive to light.  Neck:     Vascular: No carotid bruit or JVD.  Cardiovascular:     Rate and Rhythm: Normal rate and regular rhythm.     Heart sounds: Normal heart sounds.  Pulmonary:     Effort: Pulmonary effort is normal. No respiratory distress.     Breath sounds: Normal breath sounds. No wheezing or rales.  Chest:     Chest wall: No tenderness.  Abdominal:     General: Bowel sounds are normal. There is no distension or abdominal bruit.     Palpations: Abdomen is soft. There is no hepatomegaly, splenomegaly, mass or pulsatile mass.     Tenderness: There is no abdominal tenderness.  Musculoskeletal:        General: Normal range of motion.     Cervical back: Normal range of motion and neck supple.  Lymphadenopathy:     Cervical: No cervical adenopathy.  Skin:    General: Skin is warm and dry.  Neurological:     Mental Status: She is alert and oriented to person, place, and time.     Deep Tendon Reflexes: Reflexes are normal and symmetric.  Psychiatric:        Behavior: Behavior normal.        Thought Content: Thought content normal.        Judgment: Judgment normal.    BP 108/66   Pulse 80   Temp (!) 96.8 F (36 C) (Temporal)   Ht 5\' 9"  (1.753 m)   Wt 202 lb (91.6 kg)   SpO2 99%   BMI 29.83 kg/m    HGBA1c 6.6%       Assessment & Plan:   Lindsey Phillips comes in today with chief complaint of medical management of chronic issues    Diagnosis and orders addressed:  1. Diabetes mellitus treated with oral medication (HCC) Continue to watch carbs in diet - Bayer DCA Hb A1c Waived - Microalbumin / creatinine urine ratio - metFORMIN (GLUCOPHAGE) 1000 MG tablet; Take 1 tablet (1,000 mg total) by mouth 2 (two) times daily with a meal.  Dispense: 180 tablet; Refill: 1  2.  Hyperlipidemia associated with type 2 diabetes mellitus (HCC) Low fat diet - CBC with Differential/Platelet - CMP14+EGFR - Lipid panel - atorvastatin (LIPITOR) 40 MG tablet; Take 1 tablet (40 mg total) by mouth daily.  Dispense: 90 tablet; Refill: 1  3. Vitamin D deficiency Continue daily vitamin d supplement  4. BMI 29.0-29.9 adult Discussed  diet and exercise for person with BMI >25 Will recheck weight in 3-6 months    Labs pending Health Maintenance reviewed Diet and exercise encouraged  Follow up plan: 6 months   Mary-Margaret Daphine Deutscher, FNP

## 2023-08-21 LAB — CBC WITH DIFFERENTIAL/PLATELET
Basophils Absolute: 0 10*3/uL (ref 0.0–0.2)
Basos: 0 %
EOS (ABSOLUTE): 0.1 10*3/uL (ref 0.0–0.4)
Eos: 2 %
Hematocrit: 39 % (ref 34.0–46.6)
Hemoglobin: 12.2 g/dL (ref 11.1–15.9)
Immature Grans (Abs): 0 10*3/uL (ref 0.0–0.1)
Immature Granulocytes: 0 %
Lymphocytes Absolute: 2.3 10*3/uL (ref 0.7–3.1)
Lymphs: 42 %
MCH: 26 pg — ABNORMAL LOW (ref 26.6–33.0)
MCHC: 31.3 g/dL — ABNORMAL LOW (ref 31.5–35.7)
MCV: 83 fL (ref 79–97)
Monocytes Absolute: 0.4 10*3/uL (ref 0.1–0.9)
Monocytes: 7 %
Neutrophils Absolute: 2.6 10*3/uL (ref 1.4–7.0)
Neutrophils: 49 %
Platelets: 266 10*3/uL (ref 150–450)
RBC: 4.69 x10E6/uL (ref 3.77–5.28)
RDW: 14.7 % (ref 11.7–15.4)
WBC: 5.4 10*3/uL (ref 3.4–10.8)

## 2023-08-21 LAB — CMP14+EGFR
ALT: 15 [IU]/L (ref 0–32)
AST: 17 [IU]/L (ref 0–40)
Albumin: 4.3 g/dL (ref 3.8–4.8)
Alkaline Phosphatase: 144 [IU]/L — ABNORMAL HIGH (ref 44–121)
BUN/Creatinine Ratio: 19 (ref 12–28)
BUN: 19 mg/dL (ref 8–27)
Bilirubin Total: 0.4 mg/dL (ref 0.0–1.2)
CO2: 24 mmol/L (ref 20–29)
Calcium: 10 mg/dL (ref 8.7–10.3)
Chloride: 104 mmol/L (ref 96–106)
Creatinine, Ser: 1 mg/dL (ref 0.57–1.00)
Globulin, Total: 3.2 g/dL (ref 1.5–4.5)
Glucose: 112 mg/dL — ABNORMAL HIGH (ref 70–99)
Potassium: 4.5 mmol/L (ref 3.5–5.2)
Sodium: 142 mmol/L (ref 134–144)
Total Protein: 7.5 g/dL (ref 6.0–8.5)
eGFR: 60 mL/min/{1.73_m2} (ref >59)

## 2023-08-21 LAB — LIPID PANEL
Chol/HDL Ratio: 4.9 {ratio} — ABNORMAL HIGH (ref 0.0–4.4)
Cholesterol, Total: 175 mg/dL (ref 100–199)
HDL: 36 mg/dL — ABNORMAL LOW (ref >39)
LDL Chol Calc (NIH): 116 mg/dL — ABNORMAL HIGH (ref 0–99)
Triglycerides: 126 mg/dL (ref 0–149)
VLDL Cholesterol Cal: 23 mg/dL (ref 5–40)

## 2023-08-21 LAB — VITAMIN D 25 HYDROXY (VIT D DEFICIENCY, FRACTURES): Vit D, 25-Hydroxy: 40.9 ng/mL (ref 30.0–100.0)

## 2023-10-11 DIAGNOSIS — H25811 Combined forms of age-related cataract, right eye: Secondary | ICD-10-CM | POA: Diagnosis not present

## 2023-10-11 DIAGNOSIS — E119 Type 2 diabetes mellitus without complications: Secondary | ICD-10-CM | POA: Diagnosis not present

## 2023-10-11 DIAGNOSIS — Z961 Presence of intraocular lens: Secondary | ICD-10-CM | POA: Diagnosis not present

## 2023-10-11 LAB — HM DIABETES EYE EXAM

## 2023-10-15 ENCOUNTER — Other Ambulatory Visit: Payer: Self-pay

## 2023-11-17 ENCOUNTER — Other Ambulatory Visit: Payer: Self-pay | Admitting: Nurse Practitioner

## 2023-11-17 DIAGNOSIS — E119 Type 2 diabetes mellitus without complications: Secondary | ICD-10-CM

## 2024-01-03 ENCOUNTER — Ambulatory Visit: Payer: Self-pay

## 2024-01-03 NOTE — Telephone Encounter (Signed)
 FYI Only or Action Required?: FYI only for provider.  Patient was last seen in primary care on 08/20/2023 by Lindsey Feller, FNP. Called Nurse Triage reporting Foot Injury and Pain. Symptoms began several months ago. Interventions attempted: OTC medications: Tylenol . Symptoms are: Right heel paingradually worsening.  Triage Disposition: See PCP When Office is Open (Within 3 Days)  Patient/caregiver understands and will follow disposition?: Yes                   Copied from CRM #[id number]. Topic: Clinical - Red Word Triage >> Jan 03, 2024 12:41 PM [name] wrote: Red Word that prompted transfer to Nurse Triage: Lindsey Phillips the daughter of the patient called stating the patient has complained of right heel pain that has been getting worse in the last couple of weeks. I will transfer the call the E2C2 NT Reason for Disposition  [1] MODERATE pain (e.g., interferes with normal activities, limping) AND [2] present > 3 days  Answer Assessment - Initial Assessment Questions 1. ONSET: When did the pain start?      2-3 months 2. LOCATION: Where is the pain located?      Right heel 3. PAIN: How bad is the pain?    (Scale 1-10; or mild, moderate, severe)  - MILD (1-3): doesn't interfere with normal activities.   - MODERATE (4-7): interferes with normal activities (e.g., work or school) or awakens from sleep, limping.   - SEVERE (8-10): excruciating pain, unable to do any normal activities, unable to walk.      8/10 4. WORK OR EXERCISE: Has there been any recent work or exercise that involved this part of the body?      no 5. CAUSE: What do you think is causing the foot pain?     unsure 6. OTHER SYMPTOMS: Do you have any other symptoms? (e.g., leg pain, rash, fever, numbness)     no  Protocols used: Foot Pain-A-AH

## 2024-01-03 NOTE — Telephone Encounter (Signed)
 Patient scheduled to see Anton Baton on June 23rd.

## 2024-01-07 ENCOUNTER — Ambulatory Visit (INDEPENDENT_AMBULATORY_CARE_PROVIDER_SITE_OTHER): Admitting: Nurse Practitioner

## 2024-01-07 ENCOUNTER — Encounter: Payer: Self-pay | Admitting: Nurse Practitioner

## 2024-01-07 VITALS — BP 115/73 | HR 79 | Temp 97.3°F | Ht 69.0 in | Wt 200.0 lb

## 2024-01-07 DIAGNOSIS — M9262 Juvenile osteochondrosis of tarsus, left ankle: Secondary | ICD-10-CM

## 2024-01-07 DIAGNOSIS — M25571 Pain in right ankle and joints of right foot: Secondary | ICD-10-CM | POA: Insufficient documentation

## 2024-01-07 MED ORDER — NAPROXEN 500 MG PO TABS: 500.0000 mg | ORAL_TABLET | Freq: Two times a day (BID) | ORAL | 0 refills | Status: DC

## 2024-01-07 NOTE — Progress Notes (Signed)
 Acute Office Visit  Subjective:     Patient ID: Lindsey Phillips, female    DOB: 27-Jun-1951, 73 y.o.   MRN: [id number]  Chief Complaint  Patient presents with   Foot Pain    Right side foot and ankle pain and swelling for past 2 months    Ms. Santini presents with a 57-month history of left ankle pain. She describes the pain as sharp and non-radiating, with a severity of 6/10. Notably, she reports that the pain tends to worsen as the day progresses, and, somewhat unusually, rest appears to exacerbate her discomfort. The patient has experienced similar issues in the past, during which she was managed with the use of a surgical boot. Her pain is aggravated by weight-bearing activities such as walking, and by wearing closed-toe shoes. Additionally, Ms. Sherrard has a known history of Haglund's deformity of the left heel, which may be contributory to her current symptoms.   Active Ambulatory Problems    Diagnosis Date Noted   Hyperlipidemia associated with type 2 diabetes mellitus (HCC) 10/16/2012   Vitamin D  deficiency 05/23/2016   BMI 28.0-28.9,adult 09/22/2016   Haglund's deformity of left heel 12/30/2020   Type 2 diabetes mellitus without complication, without long-term current use of insulin  (HCC) 05/17/2021   Right ankle pain 01/07/2024   Resolved Ambulatory Problems    Diagnosis Date Noted   Pyogenic arthritis of right knee joint (HCC) 07/02/2016   Hypokalemia 07/03/2016   Septic arthritis (HCC) 07/03/2016   Past Medical History:  Diagnosis Date   Arthritis    Hyperlipidemia     Review of Systems  Constitutional:  Negative for chills and fever.  Cardiovascular:  Negative for chest pain and leg swelling.  Gastrointestinal:  Negative for abdominal pain, diarrhea, nausea and vomiting.  Musculoskeletal:        Right ankle pain fro 59-months  Skin:  Negative for itching and rash.  Neurological:  Negative for dizziness and headaches.   Negative unless indicated in HPI    Objective:     BP 115/73   Pulse 79   Temp (!) 97.3 F (36.3 C) (Temporal)   Ht 5' 9 (1.753 m)   Wt 200 lb (90.7 kg)   SpO2 96%   BMI 29.53 kg/m  BP Readings from Last 3 Encounters:  01/07/24 115/73  08/20/23 108/66  02/19/23 (!) 128/58   Wt Readings from Last 3 Encounters:  01/07/24 200 lb (90.7 kg)  08/20/23 202 lb (91.6 kg)  06/04/23 206 lb (93.4 kg)      Physical Exam Vitals and nursing note reviewed.  Constitutional:      General: She is not in acute distress. HENT:     Head: Normocephalic and atraumatic.     Mouth/Throat:     Mouth: Mucous membranes are moist.   Eyes:     Extraocular Movements: Extraocular movements intact.     Conjunctiva/sclera: Conjunctivae normal.     Pupils: Pupils are equal, round, and reactive to light.    Cardiovascular:     Heart sounds: Normal heart sounds.  Pulmonary:     Effort: Pulmonary effort is normal.     Breath sounds: Normal breath sounds.   Musculoskeletal:        General: Normal range of motion.     Right lower leg: No edema.     Left lower leg: No edema.     Right ankle: No swelling. No tenderness. Normal range of motion. Normal pulse.     Left ankle:  Normal.   Skin:    General: Skin is warm and dry.     Findings: No rash.   Neurological:     Mental Status: She is alert.   Psychiatric:        Mood and Affect: Mood normal.        Behavior: Behavior normal.        Thought Content: Thought content normal.        Judgment: Judgment normal.     No results found for any visits on 01/07/24.      Assessment & Plan:  Right ankle pain, unspecified chronicity -     Ambulatory referral to Podiatry  Haglund's deformity of left heel -     Ambulatory referral to Podiatry  Other orders -     Naproxen; Take 1 tablet (500 mg total) by mouth 2 (two) times daily with a meal.  Dispense: 30 tablet; Refill: 0   Naidelin is a 73 year old African-American female seen today for right ankle pain, no acute distress Right ankle pain:  Naproxen 500 mg twice daily take with food, referral to podiatry for further care - Advised use of ice packs 15-20 minutes several times a day to reduce inflammation and discomfort. - Encouraged wearing open-back or soft-heeled supportive shoes to minimize pressure on the heel. -Avoid tight or rigid shoes that may aggravate the area. - Limit prolonged walking or standing when possible. - Use cushioned inserts or heel pads to relieve pressure.     The above assessment and management plan was discussed with the patient. The patient verbalized understanding of and has agreed to the management plan. Patient is aware to call the clinic if they develop any new symptoms or if symptoms persist or worsen. Patient is aware when to return to the clinic for a follow-up visit. Patient educated on when it is appropriate to go to the emergency department.  Return if symptoms worsen or fail to improve.  Sabastian Raimondi [place] Thompson, DNP [facility] [address] [place], [place] [address] [phone]  Note: This document was prepared by [employer] voice dictation technology and any errors that results from this process are unintentional.

## 2024-02-11 ENCOUNTER — Other Ambulatory Visit: Payer: Self-pay | Admitting: Nurse Practitioner

## 2024-02-11 DIAGNOSIS — E1169 Type 2 diabetes mellitus with other specified complication: Secondary | ICD-10-CM

## 2024-02-18 ENCOUNTER — Ambulatory Visit: Payer: 59 | Admitting: Nurse Practitioner

## 2024-02-18 ENCOUNTER — Encounter: Payer: Self-pay | Admitting: Nurse Practitioner

## 2024-02-18 VITALS — BP 107/67 | HR 83 | Temp 97.6°F | Ht 69.0 in | Wt 201.0 lb

## 2024-02-18 DIAGNOSIS — E559 Vitamin D deficiency, unspecified: Secondary | ICD-10-CM

## 2024-02-18 DIAGNOSIS — Z7984 Long term (current) use of oral hypoglycemic drugs: Secondary | ICD-10-CM | POA: Diagnosis not present

## 2024-02-18 DIAGNOSIS — Z6828 Body mass index (BMI) 28.0-28.9, adult: Secondary | ICD-10-CM | POA: Diagnosis not present

## 2024-02-18 DIAGNOSIS — Z0001 Encounter for general adult medical examination with abnormal findings: Secondary | ICD-10-CM

## 2024-02-18 DIAGNOSIS — E119 Type 2 diabetes mellitus without complications: Secondary | ICD-10-CM

## 2024-02-18 DIAGNOSIS — M25571 Pain in right ankle and joints of right foot: Secondary | ICD-10-CM | POA: Diagnosis not present

## 2024-02-18 DIAGNOSIS — E785 Hyperlipidemia, unspecified: Secondary | ICD-10-CM | POA: Diagnosis not present

## 2024-02-18 DIAGNOSIS — G8929 Other chronic pain: Secondary | ICD-10-CM

## 2024-02-18 DIAGNOSIS — E1169 Type 2 diabetes mellitus with other specified complication: Secondary | ICD-10-CM | POA: Diagnosis not present

## 2024-02-18 DIAGNOSIS — Z0000 Encounter for general adult medical examination without abnormal findings: Secondary | ICD-10-CM

## 2024-02-18 LAB — LIPID PANEL

## 2024-02-18 LAB — BAYER DCA HB A1C WAIVED: HB A1C (BAYER DCA - WAIVED): 6.3 % — ABNORMAL HIGH (ref 4.8–5.6)

## 2024-02-18 MED ORDER — NAPROXEN 500 MG PO TABS: 500.0000 mg | ORAL_TABLET | Freq: Two times a day (BID) | ORAL | 2 refills | Status: DC

## 2024-02-18 MED ORDER — ROSUVASTATIN CALCIUM 20 MG PO TABS: 20.0000 mg | ORAL_TABLET | Freq: Every day | ORAL | 0 refills | Status: DC

## 2024-02-18 MED ORDER — METFORMIN HCL 1000 MG PO TABS: 1000.0000 mg | ORAL_TABLET | Freq: Two times a day (BID) | ORAL | 1 refills | Status: AC

## 2024-02-18 NOTE — Progress Notes (Signed)
 Subjective:    Patient ID: Lindsey Phillips, female    DOB: 1950/10/26, 73 y.o.   MRN: [id number]   Chief Complaint: annual physical    HPI:  Lindsey Phillips is a 73 y.o. who identifies as a female who was assigned female at birth.   Social history: Lives with: lives by herself and talks to friends or family daily. Work history: retired   Water engineer in today for follow up of the following chronic medical issues:  1. Hyperlipidemia with target LDL less than 100 Does try to watch diet but does nit do much exercise. Lab Results  Component Value Date   CHOL 175 08/20/2023   HDL 36 (L) 08/20/2023   LDLCALC 116 (H) 08/20/2023   TRIG 126 08/20/2023   CHOLHDL 4.9 (H) 08/20/2023   The 10-year ASCVD risk score (Arnett DK, et al., 2019) is: 17.4%   2. Type 2 diabetes mellitus without complication, without long-term current use of insulin  (HCC) Fasting blood sugars are running around 120-140. NO low blood sugars' Lab Results  Component Value Date   HGBA1C 6.6 (H) 08/20/2023     3. Vitamin D  deficiency Is on daily vitamin d  supplement  4. BMI 28.0-28.9,adult No recent weight changes  Wt Readings from Last 3 Encounters:  02/18/24 201 lb (91.2 kg)  01/07/24 200 lb (90.7 kg)  08/20/23 202 lb (91.6 kg)   BMI Readings from Last 3 Encounters:  02/18/24 29.68 kg/m  01/07/24 29.53 kg/m  08/20/23 29.83 kg/m       New complaints: Right ankle pain. Has been hurting for over 2 months. 2 months ago she had swelling and pain . Was given naproxen  which helped.  No Known Allergies Outpatient Encounter Medications as of 02/18/2024  Medication Sig   Accu-Chek Softclix Lancets lancets SMARTSIG:Topical 1-4 Times Daily   blood glucose meter kit and supplies Dispense based on patient and insurance preference. Use up to four times daily as directed. (FOR ICD-10 E10.9, E11.9).   calcium  carbonate (OS-CAL) 1250 (500 Ca) MG chewable tablet Chew 1 tablet by mouth daily.   Cholecalciferol (VITAMIN  D3) 1000 units CAPS Take 1 capsule by mouth daily.   FLUAD 0.5 ML injection    glucose blood (ACCU-CHEK GUIDE) test strip TEST BLOOD SUGAR 4 TIMES DAILY DX E11.65   metFORMIN  (GLUCOPHAGE ) 1000 MG tablet TAKE 1 TABLET (1,000 MG TOTAL) BY MOUTH TWICE A DAY WITH FOOD   naproxen  (NAPROSYN ) 500 MG tablet Take 1 tablet (500 mg total) by mouth 2 (two) times daily with a meal.   rosuvastatin  (CRESTOR ) 20 MG tablet TAKE 1 TABLET BY MOUTH EVERY DAY   Facility-Administered Encounter Medications as of 02/18/2024  Medication   0.9 %  sodium chloride  infusion    Past Surgical History:  Procedure Laterality Date   ABDOMINAL HYSTERECTOMY     CATARACT EXTRACTION  03/2021   KNEE ARTHROSCOPY Right 07/03/2016   Procedure: RIGHT KNEE ARTHROSCOPY;  Surgeon: Taft FORBES Minerva, MD;  Location: [facility];  Service: Orthopedics;  Laterality: Right;    Family History  Problem Relation Age of Onset   Hypertension Mother    Diabetes Mother    Heart disease Mother    Kidney disease Mother    Diabetes Father    Hypertension Sister    Hypertension Sister    Diabetes Brother    Hypertension Brother    Diabetes Brother    Hypertension Brother    Diabetes Brother    Hypertension Brother    Diabetes  Brother    Kidney disease Brother    Colon cancer Neg Hx    Breast cancer Neg Hx       Controlled substance contract: n/a     Review of Systems  Constitutional:  Negative for diaphoresis.  Eyes:  Negative for pain.  Respiratory:  Negative for shortness of breath.   Cardiovascular:  Negative for chest pain, palpitations and leg swelling.  Gastrointestinal:  Negative for abdominal pain.  Endocrine: Negative for polydipsia.  Skin:  Negative for rash.  Neurological:  Negative for dizziness, weakness and headaches.  Hematological:  Does not bruise/bleed easily.  All other systems reviewed and are negative.      Objective:   Physical Exam Vitals and nursing note reviewed.  Constitutional:      General:  She is not in acute distress.    Appearance: Normal appearance. She is well-developed.  HENT:     Head: Normocephalic.     Right Ear: Tympanic membrane normal.     Left Ear: Tympanic membrane normal.     Nose: Nose normal.     Mouth/Throat:     Mouth: Mucous membranes are moist.  Eyes:     Pupils: Pupils are equal, round, and reactive to light.  Neck:     Vascular: No carotid bruit or JVD.  Cardiovascular:     Rate and Rhythm: Normal rate and regular rhythm.     Heart sounds: Normal heart sounds.  Pulmonary:     Effort: Pulmonary effort is normal. No respiratory distress.     Breath sounds: Normal breath sounds. No wheezing or rales.  Chest:     Chest wall: No tenderness.  Abdominal:     General: Bowel sounds are normal. There is no distension or abdominal bruit.     Palpations: Abdomen is soft. There is no hepatomegaly, splenomegaly, mass or pulsatile mass.     Tenderness: There is no abdominal tenderness.  Musculoskeletal:        General: Normal range of motion.     Cervical back: Normal range of motion and neck supple.     Comments: FROM of right ankle with pain on extension No edema  Lymphadenopathy:     Cervical: No cervical adenopathy.  Skin:    General: Skin is warm and dry.  Neurological:     Mental Status: She is alert and oriented to person, place, and time.     Deep Tendon Reflexes: Reflexes are normal and symmetric.  Psychiatric:        Behavior: Behavior normal.        Thought Content: Thought content normal.        Judgment: Judgment normal.    BP 107/67   Pulse 83   Temp 97.6 F (36.4 C) (Temporal)   Ht 5' 9 (1.753 m)   Wt 201 lb (91.2 kg)   SpO2 98%   BMI 29.68 kg/m    Hgba1c discussed at appointment 6.3%      Assessment & Plan:  Lindsey Phillips comes in today with chief complaint of Medical Management of Chronic Issues   Diagnosis and orders addressed:  1. Annual physical exam (Primary)   2. Hyperlipidemia associated with type 2  diabetes mellitus (HCC) Ow fat diet - Lipid panel - rosuvastatin  (CRESTOR ) 20 MG tablet; Take 1 tablet (20 mg total) by mouth daily.  Dispense: 90 tablet; Refill: 0  3. Diabetes mellitus treated with oral medication (HCC) Continue to watch carbs in diet - Bayer DCA Hb A1c  Waived - CBC with Differential/Platelet - CMP14+EGFR - Microalbumin / creatinine urine ratio - Vitamin B12 - metFORMIN  (GLUCOPHAGE ) 1000 MG tablet; Take 1 tablet (1,000 mg total) by mouth 2 (two) times daily with a meal.  Dispense: 180 tablet; Refill: 1  4. Vitamin D  deficiency Continue vitamin d  supplement.   5. BMI 28.0-28.9,adult Discussed diet and exercise for person with BMI >25 Will recheck weight in 3-6 months  6. Chronic pain of right ankle Ice if needed Wear elastic brace when walking alot - naproxen  (NAPROSYN ) 500 MG tablet; Take 1 tablet (500 mg total) by mouth 2 (two) times daily with a meal.  Dispense: 60 tablet; Refill: 2   Labs pending Health Maintenance reviewed Diet and exercise encouraged  Follow up plan: 6 months   Mary-Margaret Gladis, FNP

## 2024-02-19 ENCOUNTER — Ambulatory Visit: Payer: Self-pay | Admitting: Nurse Practitioner

## 2024-02-19 LAB — CMP14+EGFR
ALT: 14 IU/L (ref 0–32)
AST: 24 IU/L (ref 0–40)
Albumin: 4.2 g/dL (ref 3.8–4.8)
Alkaline Phosphatase: 119 IU/L (ref 44–121)
BUN/Creatinine Ratio: 19 (ref 12–28)
BUN: 19 mg/dL (ref 8–27)
Bilirubin Total: 0.3 mg/dL (ref 0.0–1.2)
CO2: 21 mmol/L (ref 20–29)
Calcium: 10.1 mg/dL (ref 8.7–10.3)
Chloride: 102 mmol/L (ref 96–106)
Creatinine, Ser: 0.98 mg/dL (ref 0.57–1.00)
Globulin, Total: 3.2 g/dL (ref 1.5–4.5)
Glucose: 101 mg/dL — ABNORMAL HIGH (ref 70–99)
Potassium: 4.9 mmol/L (ref 3.5–5.2)
Sodium: 140 mmol/L (ref 134–144)
Total Protein: 7.4 g/dL (ref 6.0–8.5)
eGFR: 61 mL/min/1.73 (ref >59)

## 2024-02-19 LAB — CBC WITH DIFFERENTIAL/PLATELET
Basophils Absolute: 0 x10E3/uL (ref 0.0–0.2)
Basos: 0 %
EOS (ABSOLUTE): 0.1 x10E3/uL (ref 0.0–0.4)
Eos: 2 %
Hematocrit: 37.3 % (ref 34.0–46.6)
Hemoglobin: 11.5 g/dL (ref 11.1–15.9)
Immature Grans (Abs): 0 x10E3/uL (ref 0.0–0.1)
Immature Granulocytes: 0 %
Lymphocytes Absolute: 2.2 x10E3/uL (ref 0.7–3.1)
Lymphs: 43 %
MCH: 25.9 pg — ABNORMAL LOW (ref 26.6–33.0)
MCHC: 30.8 g/dL — ABNORMAL LOW (ref 31.5–35.7)
MCV: 84 fL (ref 79–97)
Monocytes Absolute: 0.4 x10E3/uL (ref 0.1–0.9)
Monocytes: 7 %
Neutrophils Absolute: 2.4 x10E3/uL (ref 1.4–7.0)
Neutrophils: 48 %
Platelets: 255 x10E3/uL (ref 150–450)
RBC: 4.44 x10E6/uL (ref 3.77–5.28)
RDW: 14.8 % (ref 11.7–15.4)
WBC: 5.1 x10E3/uL (ref 3.4–10.8)

## 2024-02-19 LAB — LIPID PANEL
Cholesterol, Total: 138 mg/dL (ref 100–199)
HDL: 33 mg/dL — AB (ref >39)
LDL CALC COMMENT:: 4.2 ratio (ref 0.0–4.4)
LDL Chol Calc (NIH): 85 mg/dL (ref 0–99)
Triglycerides: 106 mg/dL (ref 0–149)
VLDL Cholesterol Cal: 20 mg/dL (ref 5–40)

## 2024-02-19 LAB — MICROALBUMIN / CREATININE URINE RATIO
Creatinine, Urine: 192.8 mg/dL
Microalb/Creat Ratio: 11 mg/g{creat} (ref 0–29)
Microalbumin, Urine: 22.1 ug/mL

## 2024-02-19 LAB — VITAMIN B12: Vitamin B-12: 446 pg/mL (ref 232–1245)

## 2024-03-24 LAB — HM DIABETES EYE EXAM

## 2024-05-24 ENCOUNTER — Other Ambulatory Visit: Payer: Self-pay | Admitting: Nurse Practitioner

## 2024-05-24 DIAGNOSIS — G8929 Other chronic pain: Secondary | ICD-10-CM

## 2024-08-12 ENCOUNTER — Other Ambulatory Visit: Payer: Self-pay | Admitting: Nurse Practitioner

## 2024-08-12 DIAGNOSIS — E1169 Type 2 diabetes mellitus with other specified complication: Secondary | ICD-10-CM

## 2024-08-19 ENCOUNTER — Ambulatory Visit

## 2024-08-19 ENCOUNTER — Ambulatory Visit: Payer: Self-pay | Admitting: Nurse Practitioner

## 2024-09-09 ENCOUNTER — Ambulatory Visit

## 2024-09-09 ENCOUNTER — Ambulatory Visit: Admitting: Nurse Practitioner
